# Patient Record
Sex: Male | Born: 1950
Health system: Southern US, Community
[De-identification: ages and names within clinical notes are randomized; demographics above are authoritative.]

## PROBLEM LIST (undated history)

## (undated) DIAGNOSIS — I10 Essential (primary) hypertension: Secondary | ICD-10-CM

## (undated) DIAGNOSIS — M109 Gout, unspecified: Secondary | ICD-10-CM

## (undated) DIAGNOSIS — R7309 Other abnormal glucose: Secondary | ICD-10-CM

## (undated) DIAGNOSIS — R011 Cardiac murmur, unspecified: Secondary | ICD-10-CM

## (undated) DIAGNOSIS — IMO0001 Reserved for inherently not codable concepts without codable children: Secondary | ICD-10-CM

## (undated) DIAGNOSIS — N289 Disorder of kidney and ureter, unspecified: Secondary | ICD-10-CM

## (undated) DIAGNOSIS — E119 Type 2 diabetes mellitus without complications: Secondary | ICD-10-CM

## (undated) DIAGNOSIS — K219 Gastro-esophageal reflux disease without esophagitis: Secondary | ICD-10-CM

## (undated) DIAGNOSIS — E7800 Pure hypercholesterolemia, unspecified: Secondary | ICD-10-CM

## (undated) DIAGNOSIS — K279 Peptic ulcer, site unspecified, unspecified as acute or chronic, without hemorrhage or perforation: Secondary | ICD-10-CM

## (undated) HISTORY — DX: Other abnormal glucose: R73.09

## (undated) HISTORY — DX: Reserved for inherently not codable concepts without codable children: IMO0001

## (undated) HISTORY — DX: Gout, unspecified: M10.9

## (undated) HISTORY — DX: Gastro-esophageal reflux disease without esophagitis: K21.9

## (undated) HISTORY — PX: ABDOMINAL SURGERY: SHX537

## (undated) HISTORY — DX: Peptic ulcer, site unspecified, unspecified as acute or chronic, without hemorrhage or perforation: K27.9

---

## 2011-05-19 ENCOUNTER — Emergency Department (HOSPITAL_COMMUNITY)
Admission: EM | Admit: 2011-05-19 | Discharge: 2011-05-19 | Disposition: A | Discharge status: Home / Self Care | Payer: 59 | Attending: Emergency Medicine | Admitting: Emergency Medicine

## 2011-05-19 ENCOUNTER — Encounter (HOSPITAL_COMMUNITY): Payer: Self-pay | Admitting: *Deleted

## 2011-05-19 ENCOUNTER — Emergency Department (HOSPITAL_COMMUNITY): Payer: 59

## 2011-05-19 DIAGNOSIS — S81809A Unspecified open wound, unspecified lower leg, initial encounter: Secondary | ICD-10-CM | POA: Insufficient documentation

## 2011-05-19 DIAGNOSIS — S81009A Unspecified open wound, unspecified knee, initial encounter: Secondary | ICD-10-CM | POA: Insufficient documentation

## 2011-05-19 DIAGNOSIS — Z23 Encounter for immunization: Secondary | ICD-10-CM | POA: Insufficient documentation

## 2011-05-19 DIAGNOSIS — I10 Essential (primary) hypertension: Secondary | ICD-10-CM | POA: Insufficient documentation

## 2011-05-19 DIAGNOSIS — IMO0002 Reserved for concepts with insufficient information to code with codable children: Secondary | ICD-10-CM

## 2011-05-19 DIAGNOSIS — Z87891 Personal history of nicotine dependence: Secondary | ICD-10-CM | POA: Insufficient documentation

## 2011-05-19 DIAGNOSIS — W298XXA Contact with other powered powered hand tools and household machinery, initial encounter: Secondary | ICD-10-CM | POA: Insufficient documentation

## 2011-05-19 DIAGNOSIS — S91009A Unspecified open wound, unspecified ankle, initial encounter: Secondary | ICD-10-CM | POA: Insufficient documentation

## 2011-05-19 HISTORY — DX: Essential (primary) hypertension: I10

## 2011-05-19 HISTORY — DX: Pure hypercholesterolemia, unspecified: E78.00

## 2011-05-19 MED ORDER — HYDROCODONE-ACETAMINOPHEN 5-325 MG PO TABS: 1.0000 | ORAL_TABLET | ORAL | Status: AC | PRN

## 2011-05-19 MED ORDER — TETANUS-DIPHTH-ACELL PERTUSSIS 5-2.5-18.5 LF-MCG/0.5 IM SUSP: 0.5000 mL | Freq: Once | INTRAMUSCULAR | Status: DC

## 2011-05-19 MED ORDER — CEPHALEXIN 500 MG PO CAPS: 500.0000 mg | ORAL_CAPSULE | Freq: Four times a day (QID) | ORAL | Status: AC

## 2011-05-19 MED ORDER — TETANUS-DIPHTHERIA TOXOIDS TD 5-2 LFU IM INJ
0.5000 mL | INJECTION | Freq: Once | INTRAMUSCULAR | Status: AC
  Administered 2011-05-19: 0.5 mL via INTRAMUSCULAR
  Filled 2011-05-19: qty 0.5

## 2011-05-19 MED ORDER — LIDOCAINE HCL (PF) 1 % IJ SOLN
INTRAMUSCULAR | Status: AC
  Filled 2011-05-19: qty 5

## 2011-05-19 NOTE — ED Notes (Signed)
EDP informed that Tdap unavailable.

## 2011-05-19 NOTE — ED Notes (Signed)
Large lac to right lower leg while using a skill saw. Bleeding controlled. NAD

## 2011-05-19 NOTE — ED Notes (Signed)
Pt had laceration to rt leg has been cleaned and irrigated at this time with saline and betadine also scrubbed with betadine scrub brush. Sheron Robin

## 2011-05-19 NOTE — ED Provider Notes (Addendum)
This chart was scribed for [employer]. Colon Branch, MD by [name]. The patient was seen in room [facility] at 4:23 PM.  CSN: [id number]  Arrival date & time 05/19/11  1618   First MD Initiated Contact with Patient 05/19/11 1621      Chief Complaint  Patient presents with  . Extremity Laceration    (Consider location/radiation/quality/duration/timing/severity/associated sxs/prior treatment) Patient is a 61 y.o. male presenting with skin laceration. The history is provided by the patient.  Laceration  The incident occurred less than 1 hour ago. The laceration is located on the right leg. The laceration is 11-20 cm in size. Injury mechanism: skill saw. The pain is mild. The pain has been constant since onset. He reports no foreign bodies present. His tetanus status is out of date.   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 Large open laceration. Pt is in no apparent distress or pain.   Past Medical History  Diagnosis Date  . Hypertension   . Hypercholesteremia     Past Surgical History  Procedure Date  . Abdominal surgery     No family history on file.  History  Substance Use Topics  . Smoking status: Former Games developer  . Smokeless tobacco: Not on file  . Alcohol Use: No      Review of Systems 10 Systems reviewed and are negative for acute change except as noted in the HPI.  Allergies  Review of patient's allergies indicates no known allergies.  Home Medications  No current outpatient prescriptions on file.  There were no vitals taken for this visit.  Physical Exam  Nursing note and vitals reviewed. Constitutional: He is oriented to person, place, and time. He appears well-developed and well-nourished.  HENT:  Head: Normocephalic and atraumatic.  Eyes: Conjunctivae are normal. Pupils are equal, round, and reactive to light.  Neck: Normal range of motion. Neck supple.  Cardiovascular: Normal rate, regular  rhythm and normal heart sounds.   Pulmonary/Chest: Effort normal and breath sounds normal.  Abdominal: Soft. There is no tenderness.  Neurological: He is alert and oriented to person, place, and time.  Skin: Skin is warm and dry.       12 cm laceration of the lower right leg. Bleeding controlled.  Psychiatric: He has a normal mood and affect. His behavior is normal.    ED Course  LACERATION REPAIR Date/Time: 05/19/2011 5:15 PM Performed by: IDOL, JULIE L Authorized by: Burgess Amor L Consent: Verbal consent obtained. Risks and benefits: risks, benefits and alternatives were discussed Consent given by: patient Patient identity confirmed: verbally with patient Time out: Immediately prior to procedure a "time out" was called to verify the correct patient, procedure, equipment, support staff and site/side marked as required. Body area: lower extremity Location details: right lower leg Laceration length: 12 cm Tendon involvement: none Nerve involvement: none Vascular damage: no Anesthesia: local infiltration Local anesthetic: lidocaine 1% without epinephrine Anesthetic total: 10 ml Preparation: Patient was prepped and draped in the usual sterile fashion. Irrigation solution: saline Amount of cleaning: extensive (betadine scrub) Degree of undermining: none Skin closure: staples Number of sutures: 13 Approximation: close Approximation difficulty: simple Patient tolerance: Patient tolerated the procedure well with no immediate complications.   (including critical care time) DIAGNOSTIC STUDIES: Oxygen Saturation is 97% on room air, normal by my interpretation.    COORDINATION OF CARE: Dg Tibia/fibula Right  05/19/2011  *RADIOLOGY REPORT*  Clinical Data: Deep  laceration to right leg with circular saw.  RIGHT TIBIA AND FIBULA - 2 VIEW  Comparison:  None.  Findings: There is no evidence of fracture or other focal bone lesions. Soft tissue injury is seen overlying the tibia on the AP  projection.  No evidence of radiopaque foreign body.  IMPRESSION:  No evidence of fracture or radiopaque foreign body.  Original Report Authenticated By: Danae Orleans, M.D.       MDM  Patient who sustained a laceration to his leg while cutting lumber. Tdap not available. Given tetanus. Laceration repaired by mied level. Initiated antibiotic treatment. Xray negative for bone injury. I personally performed the services described in this documentation, which was scribed in my presence. The recorded information has been reviewed and considered.  MDM Reviewed: nursing note and vitals Interpretation: x-ray          Candis Musa, PA 05/19/11 1717  Nicoletta Dress. Colon Branch, MD 05/19/11 1721  Nicoletta Dress. Colon Branch, MD 05/19/11 1730

## 2011-11-28 ENCOUNTER — Other Ambulatory Visit: Payer: Self-pay | Admitting: Family Medicine

## 2011-11-28 DIAGNOSIS — N289 Disorder of kidney and ureter, unspecified: Secondary | ICD-10-CM

## 2011-11-30 ENCOUNTER — Ambulatory Visit (HOSPITAL_COMMUNITY)
Admission: RE | Admit: 2011-11-30 | Discharge: 2011-11-30 | Disposition: A | Discharge status: Home / Self Care | Payer: 59 | Source: Ambulatory Visit | Attending: Family Medicine | Admitting: Family Medicine

## 2011-11-30 DIAGNOSIS — N289 Disorder of kidney and ureter, unspecified: Secondary | ICD-10-CM | POA: Insufficient documentation

## 2012-02-21 ENCOUNTER — Ambulatory Visit (HOSPITAL_COMMUNITY)
Admission: RE | Admit: 2012-02-21 | Discharge: 2012-02-21 | Disposition: A | Discharge status: Home / Self Care | Payer: 59 | Source: Ambulatory Visit | Attending: Nephrology | Admitting: Nephrology

## 2012-02-21 DIAGNOSIS — N261 Atrophy of kidney (terminal): Secondary | ICD-10-CM

## 2012-02-21 DIAGNOSIS — I129 Hypertensive chronic kidney disease with stage 1 through stage 4 chronic kidney disease, or unspecified chronic kidney disease: Secondary | ICD-10-CM | POA: Insufficient documentation

## 2012-02-21 DIAGNOSIS — N269 Renal sclerosis, unspecified: Secondary | ICD-10-CM | POA: Insufficient documentation

## 2012-02-21 DIAGNOSIS — N19 Unspecified kidney failure: Secondary | ICD-10-CM

## 2012-02-21 DIAGNOSIS — I10 Essential (primary) hypertension: Secondary | ICD-10-CM

## 2012-02-21 NOTE — Progress Notes (Addendum)
*  PRELIMINARY RESULTS* Vascular Ultrasound Renal Artery Duplex has been completed.   Technically difficult study due to excessive bowel gas. There is no obvious evidence of renal artery stenosis bilaterally. Unable to visualize several intrarenal artery segments as well as renal artery segments bilaterally, therefore unable to exclude stenosis in these segments. The left kidney is atrophic with evidence of cortical thinning. Intrarenal resistive indices are elevated bilaterally. There is evidence of moderate, irregular, heterogenous plaque throughout the aorta. The celiac artery demonstrates elevated velocities, suggestive of a >70% stenosis. Attempted to call Dr.Patel with preliminary results with no answer. Patient sent home and advised that if there are any  further instructions for him he will be contacted.  02/21/2012 11:09 AM Gertie Fey, RDMS, RDCS

## 2012-07-15 ENCOUNTER — Other Ambulatory Visit: Payer: Self-pay | Admitting: Family Medicine

## 2012-07-15 LAB — PSA: PSA: 0.79 ng/mL (ref <=4.00)

## 2012-07-15 LAB — HEPATIC FUNCTION PANEL
ALT: 28 U/L (ref 0–53)
AST: 28 U/L (ref 0–37)
Albumin: 4.8 g/dL (ref 3.5–5.2)
Total Protein: 7.2 g/dL (ref 6.0–8.3)

## 2012-07-15 LAB — BASIC METABOLIC PANEL
Calcium: 9.8 mg/dL (ref 8.4–10.5)
Glucose, Bld: 158 mg/dL — ABNORMAL HIGH (ref 70–99)
Potassium: 4.3 mEq/L (ref 3.5–5.3)
Sodium: 139 mEq/L (ref 135–145)

## 2012-07-15 LAB — LIPID PANEL
Cholesterol: 233 mg/dL — ABNORMAL HIGH (ref 0–200)
HDL: 33 mg/dL — ABNORMAL LOW (ref >39)
Total CHOL/HDL Ratio: 7.1 Ratio
VLDL: 43 mg/dL — ABNORMAL HIGH (ref 0–40)

## 2012-07-30 ENCOUNTER — Encounter: Payer: Self-pay | Admitting: *Deleted

## 2012-08-01 ENCOUNTER — Encounter: Payer: Self-pay | Admitting: Family Medicine

## 2012-08-01 ENCOUNTER — Ambulatory Visit (INDEPENDENT_AMBULATORY_CARE_PROVIDER_SITE_OTHER): Payer: 59 | Admitting: Family Medicine

## 2012-08-01 VITALS — BP 136/84 | HR 80 | Wt 194.0 lb

## 2012-08-01 DIAGNOSIS — R7309 Other abnormal glucose: Secondary | ICD-10-CM

## 2012-08-01 DIAGNOSIS — E119 Type 2 diabetes mellitus without complications: Secondary | ICD-10-CM

## 2012-08-01 DIAGNOSIS — R739 Hyperglycemia, unspecified: Secondary | ICD-10-CM

## 2012-08-01 DIAGNOSIS — I10 Essential (primary) hypertension: Secondary | ICD-10-CM

## 2012-08-01 DIAGNOSIS — E785 Hyperlipidemia, unspecified: Secondary | ICD-10-CM

## 2012-08-01 DIAGNOSIS — Z23 Encounter for immunization: Secondary | ICD-10-CM

## 2012-08-01 DIAGNOSIS — E1122 Type 2 diabetes mellitus with diabetic chronic kidney disease: Secondary | ICD-10-CM | POA: Insufficient documentation

## 2012-08-01 LAB — POCT GLYCOSYLATED HEMOGLOBIN (HGB A1C): Hemoglobin A1C: 6.9

## 2012-08-01 MED ORDER — PNEUMOCOCCAL VAC POLYVALENT 25 MCG/0.5ML IJ INJ: 0.5000 mL | INJECTION | Freq: Once | INTRAMUSCULAR | Status: DC

## 2012-08-01 NOTE — Progress Notes (Signed)
  Subjective:    Patient ID: Hunter Franklin, male    DOB: 1950-09-12, 62 y.o.   MRN: [id number]  Diabetes He presents for his initial diabetic visit. He has type 2 diabetes mellitus. His disease course has been worsening. Associated symptoms include polydipsia. Pertinent negatives for diabetes include no blurred vision, no polyphagia and no polyuria. There are no hypoglycemic complications. Symptoms are stable. Risk factors for coronary artery disease include diabetes mellitus, dyslipidemia and hypertension. Current diabetic treatment includes diet. He is compliant with treatment most of the time.    Hx of elevated sugars. Mo has diabetes. So-so control of sugars. Not a lot of sweets  Review of Systems  Eyes: Negative for blurred vision.  Endocrine: Positive for polydipsia. Negative for polyphagia and polyuria.   Results for orders placed in visit on 07/15/12  HEPATIC FUNCTION PANEL      Result Value Range   Total Bilirubin 0.7  0.3 - 1.2 mg/dL   Bilirubin, Direct 0.1  0.0 - 0.3 mg/dL   Indirect Bilirubin 0.6  0.0 - 0.9 mg/dL   Alkaline Phosphatase 61  39 - 117 U/L   AST 28  0 - 37 U/L   ALT 28  0 - 53 U/L   Total Protein 7.2  6.0 - 8.3 g/dL   Albumin 4.8  3.5 - 5.2 g/dL       Objective:   Physical Exam  Alert. Vitals reviewed. HEENT normal. Lungs clear. Heart regular in rhythm. Feet sensation intact. Pulses good. No significant edema.      Results for orders placed in visit on 08/01/12  POCT GLYCOSYLATED HEMOGLOBIN (HGB A1C)      Result Value Range   Hemoglobin A1C 6.9      Assessment & Plan:  Impression #1 type 2 diabetes new onset. Discussed at great length. #2 hypertension good control. Plan prescribe glucometer. Check to sugars per week. Educational session at a hospital strongly encourage. Yearly eye Dr. visits strongly encourage. No medications at this time rationale discussed. Pneumonia shot. Recheck in several months. Easily 30 minutes spent most in discussion. Aspirin 81  mg daily encouraged to 2 multitude of risk factors. WSL

## 2012-08-01 NOTE — Patient Instructions (Addendum)
Take 81 mg aspirin each day

## 2012-08-03 MED ORDER — ASPIRIN 81 MG PO CHEW: 81.0000 mg | CHEWABLE_TABLET | Freq: Every day | ORAL | Status: AC

## 2012-08-07 ENCOUNTER — Encounter (HOSPITAL_COMMUNITY): Payer: Self-pay | Admitting: Dietician

## 2012-08-07 NOTE — Progress Notes (Signed)
[facility] Diabetes Class Completion  Date:August 07, 2012  Time: 1730  Pt attended [facility]'s Diabetes [facility] Education Class on August 07, 2012.   Patient was educated on the following topics: survival skills (signs and symptoms of hyperglycemia and hypoglycemia, treatment for hypoglycemia, ideal levels for fasting and postprandial blood sugars, goal Hgb A1c level, foot care basics), recommendations for physical activity, carbohydrate metabolism in relation to diabetes, and meal planning (sources of carbohydrate, carbohydrate counting, meal planning strategies, food label reading, and portion control).   Melody Haver, RD, LDN

## 2012-10-07 ENCOUNTER — Other Ambulatory Visit: Payer: Self-pay | Admitting: Family Medicine

## 2012-10-21 ENCOUNTER — Ambulatory Visit (INDEPENDENT_AMBULATORY_CARE_PROVIDER_SITE_OTHER): Payer: 59 | Admitting: Family Medicine

## 2012-10-21 ENCOUNTER — Encounter: Payer: Self-pay | Admitting: Family Medicine

## 2012-10-21 VITALS — BP 128/78 | HR 70 | Ht 67.0 in | Wt 172.5 lb

## 2012-10-21 DIAGNOSIS — E785 Hyperlipidemia, unspecified: Secondary | ICD-10-CM

## 2012-10-21 DIAGNOSIS — I10 Essential (primary) hypertension: Secondary | ICD-10-CM

## 2012-10-21 DIAGNOSIS — E119 Type 2 diabetes mellitus without complications: Secondary | ICD-10-CM

## 2012-10-21 NOTE — Progress Notes (Signed)
  Subjective:    Patient ID: Hunter Franklin, male    DOB: 1951-01-17, 62 y.o.   MRN: [id number]  Diabetes He presents for his follow-up diabetic visit. He has type 2 diabetes mellitus. Pertinent negatives for hypoglycemia include no confusion, dizziness or mood changes. Pertinent negatives for diabetes include no blurred vision, no chest pain and no fatigue. There are no hypoglycemic complications. Symptoms are improving. Risk factors for coronary artery disease include diabetes mellitus. When asked about current treatments, none were reported. He is compliant with treatment all of the time.     Results for orders placed in visit on 10/21/12  POCT GLYCOSYLATED HEMOGLOBIN (HGB A1C)      Result Value Range   Hemoglobin A1C 5.8     BPs generally Review of Systems  Constitutional: Negative for fatigue.  Eyes: Negative for blurred vision.  Cardiovascular: Negative for chest pain.  Neurological: Negative for dizziness.  Psychiatric/Behavioral: Negative for confusion.       Objective:   Physical Exam Alert. HEENT normal. Lungs clear. Heart regular rate and rhythm. Vital stable. Abdomen benign. Extremities without edema. Feet pulses good sensation intact.       Assessment & Plan:  Impression #1 type 2 diabetes clinically improved. #2 hypertension good control. #3 hyperlipidemia. Plan maintain same meds. Diet exercise discussed in encourage check in 6 months. WSL

## 2012-11-10 ENCOUNTER — Other Ambulatory Visit: Payer: Self-pay | Admitting: Family Medicine

## 2013-01-29 ENCOUNTER — Other Ambulatory Visit: Payer: Self-pay | Admitting: Family Medicine

## 2013-02-23 ENCOUNTER — Encounter: Payer: Self-pay | Admitting: Family Medicine

## 2013-02-23 ENCOUNTER — Ambulatory Visit (INDEPENDENT_AMBULATORY_CARE_PROVIDER_SITE_OTHER): Payer: 59 | Admitting: Family Medicine

## 2013-02-23 VITALS — BP 130/88 | Ht 67.0 in | Wt 170.0 lb

## 2013-02-23 DIAGNOSIS — Z79899 Other long term (current) drug therapy: Secondary | ICD-10-CM

## 2013-02-23 DIAGNOSIS — E785 Hyperlipidemia, unspecified: Secondary | ICD-10-CM

## 2013-02-23 DIAGNOSIS — Z23 Encounter for immunization: Secondary | ICD-10-CM

## 2013-02-23 DIAGNOSIS — E119 Type 2 diabetes mellitus without complications: Secondary | ICD-10-CM

## 2013-02-23 NOTE — Progress Notes (Signed)
  Subjective:    Patient ID: Hunter Franklin, male    DOB: [birth year], 62 y.o.   MRN: [id number]  Diabetes He presents for his follow-up diabetic visit. He has type 2 diabetes mellitus. His disease course has been stable. There are no hypoglycemic associated symptoms. There are no diabetic associated symptoms. There are no hypoglycemic complications. Symptoms are stable. There are no diabetic complications. There are no known risk factors for coronary artery disease. When asked about current treatments, none were reported. He is compliant with treatment all of the time.  Patient states he has no concerns at this time.   Results for orders placed in visit on 02/23/13  POCT GLYCOSYLATED HEMOGLOBIN (HGB A1C)      Result Value Range   Hemoglobin A1C 5.5     Staying veryactive, has cut dsown a lot of weight.  Watches salt only soso. Compliant with her blood pressure medication. Checks his blood pressure elsewhere and numbers are generally in good control.  Trying to watch fats in his diet. Compliant with medications. No obvious side effects from it.  Flu vac tod  Feet feel a little numb in the toes . On further history just a great toe. Dorsum bilateral. Worse after wearing heavy boots. No tingling or numbness elsewhere.    Review of Systems No chest pain no headache no back pain some weight loss with his effort no change in bowel habits no abdominal pain no blood in stool ROS otherwise negative    Objective:   Physical Exam  Alert HEENT normal. Lungs clear. Heart rare rate and rhythm. Feet exam see elsewhere      Assessment & Plan:  Impression 1 type 2 diabetes control excellent A1c is in the low fives. Discussed. #2 hyperlipidemia status uncertain discuss. #3 hypertension good control. Numbers in good range. Plan maintain same. Flu shot today. Diet exercise discussed. Appropriate blood work. Further recommendations based results. Check in 6 months. WSL

## 2013-03-09 LAB — BASIC METABOLIC PANEL
BUN: 29 mg/dL — ABNORMAL HIGH (ref 6–23)
Chloride: 108 mEq/L (ref 96–112)
Glucose, Bld: 112 mg/dL — ABNORMAL HIGH (ref 70–99)
Potassium: 4.3 mEq/L (ref 3.5–5.3)
Sodium: 142 mEq/L (ref 135–145)

## 2013-03-09 LAB — LIPID PANEL
Cholesterol: 145 mg/dL (ref 0–200)
LDL Cholesterol: 88 mg/dL (ref 0–99)
Total CHOL/HDL Ratio: 3.3 Ratio
VLDL: 13 mg/dL (ref 0–40)

## 2013-03-09 LAB — HEPATIC FUNCTION PANEL
ALT: 37 U/L (ref 0–53)
Bilirubin, Direct: 0.1 mg/dL (ref 0.0–0.3)
Indirect Bilirubin: 0.3 mg/dL (ref 0.0–0.9)
Total Bilirubin: 0.4 mg/dL (ref 0.3–1.2)

## 2013-03-10 LAB — MICROALBUMIN, URINE: Microalb, Ur: 3.35 mg/dL — ABNORMAL HIGH (ref 0.00–1.89)

## 2013-03-19 ENCOUNTER — Encounter: Payer: Self-pay | Admitting: Family Medicine

## 2013-04-06 ENCOUNTER — Other Ambulatory Visit: Payer: Self-pay | Admitting: Family Medicine

## 2013-04-15 ENCOUNTER — Other Ambulatory Visit: Payer: Self-pay | Admitting: Family Medicine

## 2013-08-20 ENCOUNTER — Ambulatory Visit (INDEPENDENT_AMBULATORY_CARE_PROVIDER_SITE_OTHER): Payer: 59 | Admitting: Family Medicine

## 2013-08-20 ENCOUNTER — Encounter: Payer: Self-pay | Admitting: Family Medicine

## 2013-08-20 VITALS — BP 124/80 | Temp 98.4°F | Ht 66.0 in | Wt 173.0 lb

## 2013-08-20 DIAGNOSIS — B029 Zoster without complications: Secondary | ICD-10-CM

## 2013-08-20 MED ORDER — VALACYCLOVIR HCL 1 G PO TABS: 1000.0000 mg | ORAL_TABLET | Freq: Three times a day (TID) | ORAL | Status: DC

## 2013-08-20 NOTE — Patient Instructions (Signed)
Shingles Shingles (herpes zoster) is an infection that is caused by the same virus that causes chickenpox (varicella). The infection causes a painful skin rash and fluid-filled blisters, which eventually break open, crust over, and heal. It may occur in any area of the body, but it usually affects only one side of the body or face. The pain of shingles usually lasts about 1 month. However, some people with shingles may develop long-term (chronic) pain in the affected area of the body. Shingles often occurs many years after the person had chickenpox. It is more common:  In people older than 50 years.  In people with weakened immune systems, such as those with HIV, AIDS, or cancer.  In people taking medicines that weaken the immune system, such as transplant medicines.  In people under great stress. CAUSES  Shingles is caused by the varicella zoster virus (VZV), which also causes chickenpox. After a person is infected with the virus, it can remain in the person's body for years in an inactive state (dormant). To cause shingles, the virus reactivates and breaks out as an infection in a nerve root. The virus can be spread from person to person (contagious) through contact with open blisters of the shingles rash. It will only spread to people who have not had chickenpox. When these people are exposed to the virus, they may develop chickenpox. They will not develop shingles. Once the blisters scab over, the person is no longer contagious and cannot spread the virus to others. SYMPTOMS  Shingles shows up in stages. The initial symptoms may be pain, itching, and tingling in an area of the skin. This pain is usually described as burning, stabbing, or throbbing.In a few days or weeks, a painful red rash will appear in the area where the pain, itching, and tingling were felt. The rash is usually on one side of the body in a band or belt-like pattern. Then, the rash usually turns into fluid-filled blisters. They  will scab over and dry up in approximately 2 3 weeks. Flu-like symptoms may also occur with the initial symptoms, the rash, or the blisters. These may include:  Fever.  Chills.  Headache.  Upset stomach. DIAGNOSIS  Your caregiver will perform a skin exam to diagnose shingles. Skin scrapings or fluid samples may also be taken from the blisters. This sample will be examined under a microscope or sent to a lab for further testing. TREATMENT  There is no specific cure for shingles. Your caregiver will likely prescribe medicines to help you manage the pain, recover faster, and avoid long-term problems. This may include antiviral drugs, anti-inflammatory drugs, and pain medicines. HOME CARE INSTRUCTIONS   Take a cool bath or apply cool compresses to the area of the rash or blisters as directed. This may help with the pain and itching.   Only take over-the-counter or prescription medicines as directed by your caregiver.   Rest as directed by your caregiver.  Keep your rash and blisters clean with mild soap and cool water or as directed by your caregiver.  Do not pick your blisters or scratch your rash. Apply an anti-itch cream or numbing creams to the affected area as directed by your caregiver.  Keep your shingles rash covered with a loose bandage (dressing).  Avoid skin contact with:  Babies.   Pregnant women.   Children with eczema.   Elderly people with transplants.   People with chronic illnesses, such as leukemia or AIDS.   Wear loose-fitting clothing to help ease   the pain of material rubbing against the rash.  Keep all follow-up appointments with your caregiver.If the area involved is on your face, you may receive a referral for follow-up to a specialist, such as an eye doctor (ophthalmologist) or an ear, nose, and throat (ENT) doctor. Keeping all follow-up appointments will help you avoid eye complications, chronic pain, or disability.  SEEK IMMEDIATE MEDICAL  CARE IF:   You have facial pain, pain around the eye area, or loss of feeling on one side of your face.  You have ear pain or ringing in your ear.  You have loss of taste.  Your pain is not relieved with prescribed medicines.   Your redness or swelling spreads.   You have more pain and swelling.  Your condition is worsening or has changed.   You have a feveror persistent symptoms for more than 2 3 days.  You have a fever and your symptoms suddenly get worse. MAKE SURE YOU:  Understand these instructions.  Will watch your condition.  Will get help right away if you are not doing well or get worse. Document Released: 04/02/2005 Document Revised: 12/26/2011 Document Reviewed: 11/15/2011 [employer] Patient Information 2014 [employer].  

## 2013-08-20 NOTE — Progress Notes (Signed)
   Subjective:    Patient ID: Hunter Franklin, male    DOB: [birth year], 63 y.o.   MRN: [id number]  Rash This is a new problem. The current episode started in the past 7 days. The affected locations include the left arm, left wrist and left hand. The rash is characterized by blistering, burning and pain. Treatments tried: ibuprofen. The treatment provided moderate relief.   Relates moderate pain discomfort denies any weakness   Review of Systems  Skin: Positive for rash.   Denies fever chills    Objective:   Physical Exam  Rest of skin exam is normal but he does have classic shingles from the shoulder area all the way down to the hand blisters on the hands      Assessment & Plan:  Shingles. Valtrex 1 gram TID for 10 days. Warnings discussed, post shingles neuralgia discussed followup if problems

## 2013-09-16 ENCOUNTER — Telehealth: Payer: Self-pay | Admitting: Family Medicine

## 2013-09-16 NOTE — Telephone Encounter (Signed)
Pt was diagnosed with the shingles in the last month Wants to know if he can get a script to go get the shingles  Vaccine at this point?

## 2013-09-16 NOTE — Telephone Encounter (Signed)
Pt's wife notified script ready up front.

## 2013-09-16 NOTE — Telephone Encounter (Signed)
sure

## 2013-10-05 ENCOUNTER — Other Ambulatory Visit: Payer: Self-pay | Admitting: Family Medicine

## 2013-10-19 ENCOUNTER — Other Ambulatory Visit: Payer: Self-pay | Admitting: Family Medicine

## 2014-01-12 LAB — HM DIABETES EYE EXAM

## 2014-01-13 ENCOUNTER — Other Ambulatory Visit: Payer: Self-pay | Admitting: Family Medicine

## 2014-04-05 ENCOUNTER — Other Ambulatory Visit: Payer: Self-pay | Admitting: *Deleted

## 2014-04-05 MED ORDER — PRAVASTATIN SODIUM 20 MG PO TABS: 20.0000 mg | ORAL_TABLET | Freq: Every day | ORAL | Status: DC

## 2014-04-15 ENCOUNTER — Other Ambulatory Visit: Payer: Self-pay | Admitting: Family Medicine

## 2014-04-21 ENCOUNTER — Other Ambulatory Visit: Payer: Self-pay | Admitting: *Deleted

## 2014-04-21 MED ORDER — FENOFIBRATE 160 MG PO TABS: ORAL_TABLET | ORAL | Status: DC

## 2014-05-24 ENCOUNTER — Other Ambulatory Visit: Payer: Self-pay | Admitting: Family Medicine

## 2014-06-14 ENCOUNTER — Encounter: Payer: Self-pay | Admitting: Family Medicine

## 2014-06-14 ENCOUNTER — Ambulatory Visit (INDEPENDENT_AMBULATORY_CARE_PROVIDER_SITE_OTHER): Payer: 59 | Admitting: Family Medicine

## 2014-06-14 VITALS — BP 130/82 | Ht 66.0 in | Wt 184.4 lb

## 2014-06-14 DIAGNOSIS — E785 Hyperlipidemia, unspecified: Secondary | ICD-10-CM | POA: Diagnosis not present

## 2014-06-14 DIAGNOSIS — I10 Essential (primary) hypertension: Secondary | ICD-10-CM | POA: Diagnosis not present

## 2014-06-14 DIAGNOSIS — Z79899 Other long term (current) drug therapy: Secondary | ICD-10-CM

## 2014-06-14 DIAGNOSIS — E119 Type 2 diabetes mellitus without complications: Secondary | ICD-10-CM | POA: Diagnosis not present

## 2014-06-14 DIAGNOSIS — Z125 Encounter for screening for malignant neoplasm of prostate: Secondary | ICD-10-CM

## 2014-06-14 MED ORDER — AMLODIPINE BESYLATE 10 MG PO TABS: 10.0000 mg | ORAL_TABLET | Freq: Every day | ORAL | Status: DC

## 2014-06-14 MED ORDER — AZITHROMYCIN 250 MG PO TABS: ORAL_TABLET | ORAL | Status: DC

## 2014-06-14 MED ORDER — PRAVASTATIN SODIUM 20 MG PO TABS: 20.0000 mg | ORAL_TABLET | Freq: Every day | ORAL | Status: DC

## 2014-06-14 MED ORDER — CARVEDILOL 6.25 MG PO TABS: 6.2500 mg | ORAL_TABLET | Freq: Two times a day (BID) | ORAL | Status: DC

## 2014-06-14 MED ORDER — FENOFIBRATE 160 MG PO TABS: ORAL_TABLET | ORAL | Status: DC

## 2014-06-14 NOTE — Progress Notes (Signed)
   Subjective:    Patient ID: Hunter Franklin, male    DOB: February 12, 1951, 64 y.o.   MRN: [id number]  Hyperlipidemia This is a chronic problem. The current episode started more than 1 year ago. Treatments tried: pravachol, fenofibrate. There are no compliance problems.  Risk factors for coronary artery disease include dyslipidemia and hypertension.   glu mostly 80s and 90. Known history of diabetes. Did not follow-up 6 month checkup is requested. States sugars running good. No medications at this time.  Sticking with meds faithfully   Staying very active . Exercises regularly.  Physically runny nose and cough and congestion   Patient also has a head cold. Several weeks' duration nasal discharge and cough.  Claims compliance with blood pressure medicine. No obvious side effects. Watching salt in diet.  lk  Review of Systems No headache no chest pain no back pain abdominal pain no change in bowel habits no blood in stool    Objective:   Physical Exam Alert blood pressure good on repeat moderate malaise HEENT moderate nasal congestion frontal tenderness pharynx normal lungs clear heart rare rhythm ankles without edema       Assessment & Plan:  Impression 1 hypertension good control #2 hyperlipidemia status uncertain #3 type 2 diabetes status uncertain. #4 rhinosinusitis plan appropriate blood work. Antibiotics prescribed. Diet exercise discussed. Recheck in 6 months. WSL

## 2014-08-07 LAB — HEMOGLOBIN A1C
HEMOGLOBIN A1C: 5.9 % — AB (ref <5.7)
MEAN PLASMA GLUCOSE: 123 mg/dL — AB (ref <117)

## 2014-08-07 LAB — LIPID PANEL
CHOL/HDL RATIO: 5.3 ratio
Cholesterol: 168 mg/dL (ref 0–200)
HDL: 32 mg/dL — ABNORMAL LOW (ref >=40)
LDL CALC: 106 mg/dL — AB (ref 0–99)
Triglycerides: 148 mg/dL (ref <150)
VLDL: 30 mg/dL (ref 0–40)

## 2014-08-07 LAB — HEPATIC FUNCTION PANEL
ALBUMIN: 4.3 g/dL (ref 3.5–5.2)
ALT: 17 U/L (ref 0–53)
AST: 25 U/L (ref 0–37)
Alkaline Phosphatase: 48 U/L (ref 39–117)
BILIRUBIN TOTAL: 0.5 mg/dL (ref 0.2–1.2)
Bilirubin, Direct: 0.1 mg/dL (ref 0.0–0.3)
Indirect Bilirubin: 0.4 mg/dL (ref 0.2–1.2)
Total Protein: 6.7 g/dL (ref 6.0–8.3)

## 2014-08-07 LAB — PSA: PSA: 1.01 ng/mL (ref <=4.00)

## 2014-08-07 LAB — MICROALBUMIN, URINE: Microalb, Ur: 3.1 mg/dL — ABNORMAL HIGH (ref <2.0)

## 2014-08-08 ENCOUNTER — Encounter: Payer: Self-pay | Admitting: Family Medicine

## 2014-09-26 ENCOUNTER — Encounter (HOSPITAL_COMMUNITY): Payer: Self-pay | Admitting: *Deleted

## 2014-09-26 ENCOUNTER — Emergency Department (HOSPITAL_COMMUNITY): Payer: 59

## 2014-09-26 ENCOUNTER — Emergency Department (HOSPITAL_COMMUNITY)
Admission: EM | Admit: 2014-09-26 | Discharge: 2014-09-27 | Disposition: A | Discharge status: Home / Self Care | Payer: 59 | Attending: Emergency Medicine | Admitting: Emergency Medicine

## 2014-09-26 DIAGNOSIS — Z8711 Personal history of peptic ulcer disease: Secondary | ICD-10-CM | POA: Diagnosis not present

## 2014-09-26 DIAGNOSIS — Z7982 Long term (current) use of aspirin: Secondary | ICD-10-CM | POA: Insufficient documentation

## 2014-09-26 DIAGNOSIS — Z79899 Other long term (current) drug therapy: Secondary | ICD-10-CM | POA: Insufficient documentation

## 2014-09-26 DIAGNOSIS — M549 Dorsalgia, unspecified: Secondary | ICD-10-CM | POA: Diagnosis not present

## 2014-09-26 DIAGNOSIS — I10 Essential (primary) hypertension: Secondary | ICD-10-CM | POA: Insufficient documentation

## 2014-09-26 DIAGNOSIS — R509 Fever, unspecified: Secondary | ICD-10-CM | POA: Insufficient documentation

## 2014-09-26 DIAGNOSIS — Z87448 Personal history of other diseases of urinary system: Secondary | ICD-10-CM | POA: Diagnosis not present

## 2014-09-26 DIAGNOSIS — R112 Nausea with vomiting, unspecified: Secondary | ICD-10-CM

## 2014-09-26 DIAGNOSIS — Z8719 Personal history of other diseases of the digestive system: Secondary | ICD-10-CM | POA: Diagnosis not present

## 2014-09-26 DIAGNOSIS — Z792 Long term (current) use of antibiotics: Secondary | ICD-10-CM | POA: Insufficient documentation

## 2014-09-26 DIAGNOSIS — R0981 Nasal congestion: Secondary | ICD-10-CM | POA: Diagnosis not present

## 2014-09-26 DIAGNOSIS — R17 Unspecified jaundice: Secondary | ICD-10-CM | POA: Diagnosis not present

## 2014-09-26 DIAGNOSIS — Z87891 Personal history of nicotine dependence: Secondary | ICD-10-CM | POA: Insufficient documentation

## 2014-09-26 DIAGNOSIS — R7989 Other specified abnormal findings of blood chemistry: Secondary | ICD-10-CM

## 2014-09-26 DIAGNOSIS — E780 Pure hypercholesterolemia: Secondary | ICD-10-CM | POA: Insufficient documentation

## 2014-09-26 DIAGNOSIS — R945 Abnormal results of liver function studies: Secondary | ICD-10-CM

## 2014-09-26 HISTORY — DX: Disorder of kidney and ureter, unspecified: N28.9

## 2014-09-26 LAB — CBC WITH DIFFERENTIAL/PLATELET
BASOS PCT: 0 % (ref 0–1)
Basophils Absolute: 0 10*3/uL (ref 0.0–0.1)
EOS PCT: 2 % (ref 0–5)
Eosinophils Absolute: 0.1 10*3/uL (ref 0.0–0.7)
HCT: 40.6 % (ref 39.0–52.0)
HEMOGLOBIN: 13.8 g/dL (ref 13.0–17.0)
LYMPHS PCT: 10 % — AB (ref 12–46)
Lymphs Abs: 0.6 10*3/uL — ABNORMAL LOW (ref 0.7–4.0)
MCH: 29.2 pg (ref 26.0–34.0)
MCHC: 34 g/dL (ref 30.0–36.0)
MCV: 86 fL (ref 78.0–100.0)
MONOS PCT: 7 % (ref 3–12)
Monocytes Absolute: 0.5 10*3/uL (ref 0.1–1.0)
NEUTROS ABS: 5.1 10*3/uL (ref 1.7–7.7)
Neutrophils Relative %: 81 % — ABNORMAL HIGH (ref 43–77)
PLATELETS: 112 10*3/uL — AB (ref 150–400)
RBC: 4.72 MIL/uL (ref 4.22–5.81)
RDW: 13.2 % (ref 11.5–15.5)
WBC: 6.4 10*3/uL (ref 4.0–10.5)

## 2014-09-26 LAB — COMPREHENSIVE METABOLIC PANEL
ALK PHOS: 66 U/L (ref 38–126)
ALT: 130 U/L — ABNORMAL HIGH (ref 17–63)
ANION GAP: 13 (ref 5–15)
AST: 117 U/L — AB (ref 15–41)
Albumin: 3.8 g/dL (ref 3.5–5.0)
BUN: 25 mg/dL — ABNORMAL HIGH (ref 6–20)
CHLORIDE: 100 mmol/L — AB (ref 101–111)
CO2: 23 mmol/L (ref 22–32)
CREATININE: 1.85 mg/dL — AB (ref 0.61–1.24)
Calcium: 8.5 mg/dL — ABNORMAL LOW (ref 8.9–10.3)
GFR calc Af Amer: 43 mL/min — ABNORMAL LOW (ref >60)
GFR calc non Af Amer: 37 mL/min — ABNORMAL LOW (ref >60)
Glucose, Bld: 118 mg/dL — ABNORMAL HIGH (ref 65–99)
Potassium: 3.8 mmol/L (ref 3.5–5.1)
SODIUM: 136 mmol/L (ref 135–145)
TOTAL PROTEIN: 7.3 g/dL (ref 6.5–8.1)
Total Bilirubin: 6.5 mg/dL — ABNORMAL HIGH (ref 0.3–1.2)

## 2014-09-26 LAB — URINALYSIS, ROUTINE W REFLEX MICROSCOPIC
Glucose, UA: NEGATIVE mg/dL
Ketones, ur: NEGATIVE mg/dL
LEUKOCYTES UA: NEGATIVE
NITRITE: NEGATIVE
PROTEIN: 100 mg/dL — AB
Specific Gravity, Urine: 1.02 (ref 1.005–1.030)
UROBILINOGEN UA: 1 mg/dL (ref 0.0–1.0)
pH: 6 (ref 5.0–8.0)

## 2014-09-26 LAB — URINE MICROSCOPIC-ADD ON

## 2014-09-26 LAB — LIPASE, BLOOD: Lipase: 37 U/L (ref 22–51)

## 2014-09-26 LAB — CBG MONITORING, ED: Glucose-Capillary: 131 mg/dL — ABNORMAL HIGH (ref 65–99)

## 2014-09-26 MED ORDER — ONDANSETRON 4 MG PREPACK (~~LOC~~): 1.0000 | ORAL_TABLET | Freq: Three times a day (TID) | ORAL | Status: DC | PRN

## 2014-09-26 MED ORDER — IOHEXOL 300 MG/ML  SOLN
25.0000 mL | Freq: Once | INTRAMUSCULAR | Status: AC | PRN
  Administered 2014-09-26: 50 mL via ORAL

## 2014-09-26 MED ORDER — SODIUM CHLORIDE 0.9 % IV BOLUS (SEPSIS)
1000.0000 mL | Freq: Once | INTRAVENOUS | Status: AC
  Administered 2014-09-26: 1000 mL via INTRAVENOUS

## 2014-09-26 MED ORDER — SODIUM CHLORIDE 0.9 % IV SOLN
1000.0000 mL | Freq: Once | INTRAVENOUS | Status: AC
  Administered 2014-09-26: 1000 mL via INTRAVENOUS

## 2014-09-26 MED ORDER — ONDANSETRON HCL 4 MG/2ML IJ SOLN
4.0000 mg | Freq: Once | INTRAMUSCULAR | Status: AC
  Administered 2014-09-26: 4 mg via INTRAVENOUS
  Filled 2014-09-26: qty 2

## 2014-09-26 NOTE — Discharge Instructions (Signed)

## 2014-09-26 NOTE — ED Notes (Signed)
Pt c/o sinus drainage, n/v, generalized weakness x 3 days. Wife states pt got bit by a tick 2 weeks ago and was out in the heat since Friday. Wife states pt has been laying around since Friday.

## 2014-09-26 NOTE — ED Notes (Signed)
Patient states nausea and vomiting X2 days with abdominal pain and back pain. Patients skin and eyes appear to be jaundice. Patient states he noticed his eyes were yellow. Patient also states he feels like he need to have a bowel movement but unable to.

## 2014-09-26 NOTE — ED Provider Notes (Signed)
CSN: [id number]     Arrival date & time 09/26/14  1914 History   First MD Initiated Contact with Patient 09/26/14 1934     Chief Complaint  Patient presents with  . Emesis     (Consider location/radiation/quality/duration/timing/severity/associated sxs/prior Treatment) HPI  64 year old male with a history of diabetes presents with multiple episodes of vomiting over the past 48 hours. States 2 nights ago all of a sudden he started feeling bad and had multiple episodes of emesis. The patient had similar vomiting episodes last night and then again tonight. He has been able to keep down fluids and has not been keeping down food. No hematemesis. The patient has not had any diarrhea and had a normal bowel movement yesterday. Has also been having some nasal congestion and sinus drainage as well as diffuse myalgias. Before meals has a mild headache but thinks it started after multiple episodes of emesis. Also having some back pain that he thinks is from the vomiting. Denies any abdominal pain. No urinary symptoms. Wife states he has lost 6 pounds over this last 48 hours.  Past Medical History  Diagnosis Date  . Hypertension   . Hypercholesteremia   . PUD (peptic ulcer disease)   . Gout   . Reflux   . Impaired glucose metabolism   . Renal disorder     1 kidney that is working at 45 %.   Past Surgical History  Procedure Laterality Date  . Abdominal surgery     Family History  Problem Relation Age of Onset  . Diabetes Mother   . Heart attack Mother   . Hypertension Sister    History  Substance Use Topics  . Smoking status: Former Smoker    Quit date: 08/01/1996  . Smokeless tobacco: Not on file  . Alcohol Use: No    Review of Systems  Constitutional: Positive for fever (subjective low grade fever last night).  HENT: Positive for congestion.   Respiratory: Negative for cough and shortness of breath.   Gastrointestinal: Positive for nausea and vomiting. Negative for abdominal pain,  diarrhea and constipation.  Genitourinary: Negative for dysuria.  Musculoskeletal: Positive for back pain.  All other systems reviewed and are negative.     Allergies  Review of patient's allergies indicates no known allergies.  Home Medications   Prior to Admission medications   Medication Sig Start Date End Date Taking? Authorizing Provider  amLODipine (NORVASC) 10 MG tablet Take 1 tablet (10 mg total) by mouth daily. 06/14/14   Mikey Kirschner, MD  aspirin 81 MG tablet Take 81 mg by mouth daily.    Historical Provider, MD  azithromycin (ZITHROMAX Z-PAK) 250 MG tablet Take 2 tablets (500 mg) on  Day 1,  followed by 1 tablet (250 mg) once daily on Days 2 through 5. 06/14/14   Mikey Kirschner, MD  carvedilol (COREG) 6.25 MG tablet Take 1 tablet (6.25 mg total) by mouth 2 (two) times daily with a meal. 06/14/14   Mikey Kirschner, MD  fenofibrate 160 MG tablet TAKE 1 TABLET BY MOUTH ONCE DAILY AT BEDTIME 06/14/14   Mikey Kirschner, MD  pravastatin (PRAVACHOL) 20 MG tablet Take 1 tablet (20 mg total) by mouth at bedtime. 06/14/14   Mikey Kirschner, MD   BP 150/63 mmHg  Pulse 71  Temp(Src) 98.8 F (37.1 C) (Oral)  Resp 20  Ht 5\' 7"  (1.702 m)  Wt 171 lb (77.565 kg)  BMI 26.78 kg/m2  SpO2 97% Physical Exam  Constitutional: He is oriented to person, place, and time. He appears well-developed and well-nourished.  HENT:  Head: Normocephalic and atraumatic.  Right Ear: External ear normal.  Left Ear: External ear normal.  Nose: Nose normal.  Eyes: Right eye exhibits no discharge. Left eye exhibits no discharge.  Neck: Neck supple.  Cardiovascular: Normal rate, regular rhythm, normal heart sounds and intact distal pulses.   Pulmonary/Chest: Effort normal and breath sounds normal.  Abdominal: Soft. He exhibits no distension. There is no tenderness.  Musculoskeletal: He exhibits no edema.  Neurological: He is alert and oriented to person, place, and time.  Skin: Skin is warm and dry.   Mild jaundice to face  Nursing note and vitals reviewed.   ED Course  Procedures (including critical care time) Labs Review Labs Reviewed  CBC WITH DIFFERENTIAL/PLATELET - Abnormal; Notable for the following:    Platelets 112 (*)    Neutrophils Relative % 81 (*)    Lymphocytes Relative 10 (*)    Lymphs Abs 0.6 (*)    All other components within normal limits  COMPREHENSIVE METABOLIC PANEL - Abnormal; Notable for the following:    Chloride 100 (*)    Glucose, Bld 118 (*)    BUN 25 (*)    Creatinine, Ser 1.85 (*)    Calcium 8.5 (*)    AST 117 (*)    ALT 130 (*)    Total Bilirubin 6.5 (*)    GFR calc non Af Amer 37 (*)    GFR calc Af Amer 43 (*)    All other components within normal limits  URINALYSIS, ROUTINE W REFLEX MICROSCOPIC (NOT AT [facility]) - Abnormal; Notable for the following:    Hgb urine dipstick SMALL (*)    Bilirubin Urine LARGE (*)    Protein, ur 100 (*)    All other components within normal limits  CBG MONITORING, ED - Abnormal; Notable for the following:    Glucose-Capillary 131 (*)    All other components within normal limits  LIPASE, BLOOD  URINE MICROSCOPIC-ADD ON    Imaging Review No results found.   EKG Interpretation   Date/Time:  Sunday September 26 2014 20:02:45 EDT Ventricular Rate:  72 PR Interval:  169 QRS Duration: 94 QT Interval:  411 QTC Calculation: 450 R Axis:   -15 Text Interpretation:  Sinus rhythm Borderline left axis deviation  Otherwise normal ECG No old tracing to compare Confirmed by Chia Mowers  MD,  Keeven Matty ([id number]) on 09/26/2014 8:33:48 PM      MDM   Final diagnoses:  Nausea and vomiting in adult    Patient feels much better after IV fluids and Zofran. There has been no vomiting in the ED. He does have mild jaundice to his face and has a new elevated bilirubin as well as moderate ALT and AST elevation. Unclear where this is coming from given that he has no abdominal pain. There is no ultrasound currently available at this  facility at this time, will get a noncontrasted CT scan due to his kidney function to further evaluate for obstruction or bile duct widening. Currently is asymptomatic. Care transferred to Dr. Wilson Singer with CT pending.    Sherwood Gambler, MD 09/26/14 2145

## 2014-09-27 ENCOUNTER — Encounter: Payer: Self-pay | Admitting: Family Medicine

## 2014-09-27 ENCOUNTER — Ambulatory Visit (INDEPENDENT_AMBULATORY_CARE_PROVIDER_SITE_OTHER): Payer: 59 | Admitting: Family Medicine

## 2014-09-27 VITALS — BP 128/74 | Temp 100.7°F | Ht 66.0 in | Wt 177.0 lb

## 2014-09-27 DIAGNOSIS — K810 Acute cholecystitis: Secondary | ICD-10-CM

## 2014-09-27 LAB — BILIRUBIN, DIRECT: Bilirubin, Direct: 4 mg/dL — ABNORMAL HIGH (ref 0.1–0.5)

## 2014-09-27 NOTE — Progress Notes (Signed)
   Subjective:    Patient ID: Hunter Franklin, male    DOB: 1950-09-03, 64 y.o.   MRN: [id number]  Abdominal Pain This is a new problem. Episode onset: 3 days ago. Associated symptoms include vomiting.  taking zofran for nausea and vomiting.  Went to [facility] ED yesterday. They did bloodwork and CT scan.   Overall doing well until several days ago. Feeling somewhat better today. Still having nausea. No active vomiting today.  Has not eaten much food yet.  No headache no chest pain no back pain.   Feeling better today. Feeling weak, no intense pain, vomiting better now that he is taking zofran. Pt not able to sleep.   Review of Systems  Gastrointestinal: Positive for vomiting and abdominal pain.       Objective:   Physical Exam  Vitals stable temp 100.7. Alert slight malaise. No acute distress Excellent bowel sounds. Lungs clear. Heart regular rate and rhythm. Right upper quadrant slightly tender to deep palpation. No CVA tenderness.  Complete hospital chart reviewed in presence of patient     Assessment & Plan:  Impression nausea associated with inflammatory changes gallbladder. White blood count normal. However liver enzymes elevated including bilirubin. Repeat bilirubin improved. Patient states feeling somewhat better today. Would prefer to have his surgery done through [facility]. Dr. Excell Seltzer operated on his wife this past winter. Plan I spoke with surgeon on call. With patient clinically nontoxic and scan revealing gallbladder inflammation. Needs referral soon to surgeon. We will work on this. Addendum patient to be seen on Wednesday. Warning signs discussed. WSL

## 2014-09-27 NOTE — ED Notes (Signed)
Patient and family verbalize understanding of discharge instructions, prescription medications, home care and follow up care. Patient ambulatory out of department at this time with family.

## 2014-09-28 LAB — HEPATITIS PANEL, ACUTE
HCV Ab: 0.2 s/co ratio (ref 0.0–0.9)
Hep A IgM: NEGATIVE
Hep B C IgM: NEGATIVE
Hepatitis B Surface Ag: NEGATIVE

## 2014-09-28 NOTE — Addendum Note (Signed)
Addended by: Carmelina Noun on: 09/28/2014 08:27 AM   Modules accepted: Orders

## 2014-09-29 ENCOUNTER — Inpatient Hospital Stay (HOSPITAL_COMMUNITY)
Admission: AD | Admit: 2014-09-29 | Discharge: 2014-10-02 | DRG: 419 | Disposition: A | Discharge status: Home / Self Care | Payer: 59 | Source: Ambulatory Visit | Attending: General Surgery | Admitting: General Surgery

## 2014-09-29 ENCOUNTER — Other Ambulatory Visit: Payer: Self-pay | Admitting: Surgery

## 2014-09-29 ENCOUNTER — Encounter (HOSPITAL_COMMUNITY): Payer: Self-pay | Admitting: *Deleted

## 2014-09-29 ENCOUNTER — Encounter: Payer: Self-pay | Admitting: General Surgery

## 2014-09-29 ENCOUNTER — Inpatient Hospital Stay (HOSPITAL_COMMUNITY): Payer: 59

## 2014-09-29 DIAGNOSIS — M109 Gout, unspecified: Secondary | ICD-10-CM | POA: Diagnosis present

## 2014-09-29 DIAGNOSIS — Q5310 Unspecified undescended testicle, unilateral: Secondary | ICD-10-CM | POA: Diagnosis not present

## 2014-09-29 DIAGNOSIS — N189 Chronic kidney disease, unspecified: Secondary | ICD-10-CM | POA: Diagnosis present

## 2014-09-29 DIAGNOSIS — Z419 Encounter for procedure for purposes other than remedying health state, unspecified: Secondary | ICD-10-CM

## 2014-09-29 DIAGNOSIS — Z87891 Personal history of nicotine dependence: Secondary | ICD-10-CM | POA: Diagnosis not present

## 2014-09-29 DIAGNOSIS — K829 Disease of gallbladder, unspecified: Secondary | ICD-10-CM | POA: Diagnosis not present

## 2014-09-29 DIAGNOSIS — R17 Unspecified jaundice: Secondary | ICD-10-CM

## 2014-09-29 DIAGNOSIS — I2510 Atherosclerotic heart disease of native coronary artery without angina pectoris: Secondary | ICD-10-CM | POA: Diagnosis present

## 2014-09-29 DIAGNOSIS — K8000 Calculus of gallbladder with acute cholecystitis without obstruction: Secondary | ICD-10-CM | POA: Diagnosis present

## 2014-09-29 DIAGNOSIS — I129 Hypertensive chronic kidney disease with stage 1 through stage 4 chronic kidney disease, or unspecified chronic kidney disease: Secondary | ICD-10-CM | POA: Diagnosis present

## 2014-09-29 DIAGNOSIS — Z7982 Long term (current) use of aspirin: Secondary | ICD-10-CM

## 2014-09-29 DIAGNOSIS — K219 Gastro-esophageal reflux disease without esophagitis: Secondary | ICD-10-CM | POA: Diagnosis present

## 2014-09-29 DIAGNOSIS — E780 Pure hypercholesterolemia: Secondary | ICD-10-CM | POA: Diagnosis present

## 2014-09-29 DIAGNOSIS — Z8249 Family history of ischemic heart disease and other diseases of the circulatory system: Secondary | ICD-10-CM | POA: Diagnosis not present

## 2014-09-29 DIAGNOSIS — R1013 Epigastric pain: Secondary | ICD-10-CM | POA: Diagnosis present

## 2014-09-29 DIAGNOSIS — Z791 Long term (current) use of non-steroidal anti-inflammatories (NSAID): Secondary | ICD-10-CM | POA: Diagnosis not present

## 2014-09-29 DIAGNOSIS — Z833 Family history of diabetes mellitus: Secondary | ICD-10-CM | POA: Diagnosis not present

## 2014-09-29 DIAGNOSIS — K4090 Unilateral inguinal hernia, without obstruction or gangrene, not specified as recurrent: Secondary | ICD-10-CM | POA: Diagnosis present

## 2014-09-29 HISTORY — DX: Disease of gallbladder, unspecified: K82.9

## 2014-09-29 LAB — CBC WITH DIFFERENTIAL/PLATELET
Basophils Absolute: 0 10*3/uL (ref 0.0–0.1)
Basophils Relative: 0 % (ref 0–1)
EOS PCT: 0 % (ref 0–5)
Eosinophils Absolute: 0 10*3/uL (ref 0.0–0.7)
HEMATOCRIT: 39.7 % (ref 39.0–52.0)
HEMOGLOBIN: 13.2 g/dL (ref 13.0–17.0)
LYMPHS PCT: 9 % — AB (ref 12–46)
Lymphs Abs: 1 10*3/uL (ref 0.7–4.0)
MCH: 29.1 pg (ref 26.0–34.0)
MCHC: 33.2 g/dL (ref 30.0–36.0)
MCV: 87.4 fL (ref 78.0–100.0)
MONO ABS: 0.9 10*3/uL (ref 0.1–1.0)
MONOS PCT: 8 % (ref 3–12)
NEUTROS ABS: 9.5 10*3/uL — AB (ref 1.7–7.7)
Neutrophils Relative %: 83 % — ABNORMAL HIGH (ref 43–77)
Platelets: 127 10*3/uL — ABNORMAL LOW (ref 150–400)
RBC: 4.54 MIL/uL (ref 4.22–5.81)
RDW: 13.7 % (ref 11.5–15.5)
WBC: 11.4 10*3/uL — ABNORMAL HIGH (ref 4.0–10.5)

## 2014-09-29 LAB — COMPREHENSIVE METABOLIC PANEL
ALK PHOS: 94 U/L (ref 38–126)
ALT: 50 U/L (ref 17–63)
ANION GAP: 11 (ref 5–15)
AST: 31 U/L (ref 15–41)
Albumin: 3.2 g/dL — ABNORMAL LOW (ref 3.5–5.0)
BILIRUBIN TOTAL: 5.4 mg/dL — AB (ref 0.3–1.2)
BUN: 24 mg/dL — AB (ref 6–20)
CHLORIDE: 96 mmol/L — AB (ref 101–111)
CO2: 26 mmol/L (ref 22–32)
Calcium: 9 mg/dL (ref 8.9–10.3)
Creatinine, Ser: 1.9 mg/dL — ABNORMAL HIGH (ref 0.61–1.24)
GFR calc non Af Amer: 36 mL/min — ABNORMAL LOW (ref >60)
GFR, EST AFRICAN AMERICAN: 42 mL/min — AB (ref >60)
GLUCOSE: 103 mg/dL — AB (ref 65–99)
Potassium: 4.1 mmol/L (ref 3.5–5.1)
Sodium: 133 mmol/L — ABNORMAL LOW (ref 135–145)
TOTAL PROTEIN: 7.1 g/dL (ref 6.5–8.1)

## 2014-09-29 MED ORDER — POTASSIUM CL IN DEXTROSE 5% 20 MEQ/L IV SOLN
20.0000 meq | INTRAVENOUS | Status: DC
  Administered 2014-09-29 – 2014-09-30 (×3): 20 meq via INTRAVENOUS
  Filled 2014-09-29 (×5): qty 1000

## 2014-09-29 MED ORDER — MORPHINE SULFATE 2 MG/ML IJ SOLN
1.0000 mg | INTRAMUSCULAR | Status: DC | PRN
  Administered 2014-09-30: 2 mg via INTRAVENOUS
  Filled 2014-09-29: qty 1

## 2014-09-29 MED ORDER — DIPHENHYDRAMINE HCL 50 MG/ML IJ SOLN
25.0000 mg | Freq: Every evening | INTRAMUSCULAR | Status: DC | PRN
  Administered 2014-10-01: 25 mg via INTRAVENOUS
  Filled 2014-09-29: qty 1

## 2014-09-29 MED ORDER — ACETAMINOPHEN 325 MG PO TABS
650.0000 mg | ORAL_TABLET | Freq: Four times a day (QID) | ORAL | Status: DC | PRN
  Administered 2014-09-29 – 2014-09-30 (×2): 650 mg via ORAL
  Filled 2014-09-29 (×2): qty 2

## 2014-09-29 MED ORDER — HYDROCODONE-ACETAMINOPHEN 5-325 MG PO TABS: 1.0000 | ORAL_TABLET | ORAL | Status: DC | PRN

## 2014-09-29 MED ORDER — ONDANSETRON HCL 4 MG/2ML IJ SOLN: 4.0000 mg | Freq: Four times a day (QID) | INTRAMUSCULAR | Status: DC | PRN

## 2014-09-29 MED ORDER — ACETAMINOPHEN 650 MG RE SUPP: 650.0000 mg | Freq: Four times a day (QID) | RECTAL | Status: DC | PRN

## 2014-09-29 MED ORDER — CEFTRIAXONE SODIUM IN DEXTROSE 40 MG/ML IV SOLN: 2.0000 g | INTRAVENOUS | Status: DC

## 2014-09-29 MED ORDER — CETYLPYRIDINIUM CHLORIDE 0.05 % MT LIQD
7.0000 mL | Freq: Two times a day (BID) | OROMUCOSAL | Status: DC
  Administered 2014-09-29 – 2014-10-01 (×5): 7 mL via OROMUCOSAL

## 2014-09-29 MED ORDER — CEFTRIAXONE SODIUM IN DEXTROSE 20 MG/ML IV SOLN
1.0000 g | INTRAVENOUS | Status: DC
  Administered 2014-09-29: 1 g via INTRAVENOUS
  Filled 2014-09-29 (×2): qty 50

## 2014-09-29 MED ORDER — POTASSIUM CL IN DEXTROSE 5% 20 MEQ/L IV SOLN
20.0000 meq | INTRAVENOUS | Status: DC
  Filled 2014-09-29: qty 1000

## 2014-09-29 NOTE — H&P (Signed)
History of Present Illness Hunter Kitchen T. [name] MD; 09/29/2014 3:31 PM) Patient words: urgent clinic.  The patient is a 64 year old male who presents for evaluation of gall stones. He is referred by Dr. Wolfgang Phoenix for vomiting, elevated LFTs and possible cholecystitis. He was in his usual state of health until 5 days ago when he developed the rapid onset of frequent vomiting. Also had fever at that time. He thought he had the flu and stayed home for a couple of days but the symptoms continued and he presented to [facility] emergency department 3 days ago. At that time lab work was done and he was found to have abnormal LFTs with a bilirubin of 6.5 and transaminases of 117 and 1:30 respectively. Alkaline phosphatase was normal. CBC was unremarkable. CT scan of the abdomen was obtained which showed a somewhat distended gallbladder with mild thickening and a "suggestion" of stones within the gallbladder and normal intrahepatic ducts. He was felt to be okay for discharge home and followed up with Dr. Lurena Joiner in the next day. Repeat bilirubin at that time was 4.0. Apparently hepatitis testing was done per patient it was negative although I do not have these results. At some point during this illness he thinks about 3 or 4 days ago he began to have "soreness" in his right upper quadrant. Denies actual pain but his wife says he is too uncomfortable to sleep. He could not drive to the office today. She feels his skin has been yellow since the onset of illness. No previous history of liver disease or significant abdominal or GI complaints prior to this illness. He has remained nauseated but no further vomiting since this weekend. He's been taking Tylenol regularly and not noted fever. Other Problems Yehuda Mao, RMA; 09/29/2014 2:33 PM) Cholelithiasis Diabetes Mellitus Gastric Ulcer Gastroesophageal Reflux Disease High blood pressure Hypercholesterolemia  Diagnostic Studies History Yehuda Mao,  RMA; 09/29/2014 2:33 PM) Colonoscopy never  Allergies Shirlean Mylar Gwynn, RMA; 09/29/2014 2:34 PM) No Known Drug Allergies 09/29/2014  Medication History Shirlean Mylar Gwynn, RMA; 09/29/2014 2:38 PM) AmLODIPine Besylate (10MG  Tablet, Oral daily) Active. Aspirin Childrens (81MG  Tablet Chewable, Oral daily) Active. Coreg (6.25MG  Tablet, Oral daily) Active. Benadryl Allergy (25MG  Capsule, Oral as needed) Active. Fenofibrate (160MG  Tablet, Oral daily) Active. Tylenol Extra Strength (500MG  Tablet, Oral as needed) Active. Omeprazole (20MG  Capsule DR, Oral as needed) Active. Zofran ODT (4MG  Tablet Disperse, Oral as needed) Active. Pravastatin Sodium (20MG  Tablet, Oral daily) Active. Medications Reconciled  Social History Yehuda Mao, RMA; 09/29/2014 2:33 PM) Alcohol use Remotely quit alcohol use. Caffeine use Coffee. No drug use Tobacco use Former smoker.  Family History Yehuda Mao, RMA; 09/29/2014 2:33 PM) Alcohol Abuse Daughter, Father, Mother, Son. Depression Father, Son. Diabetes Mellitus Mother. Heart Disease Mother. Seizure disorder Son.     Review of Systems ([place] RMA; 09/29/2014 2:33 PM) General Present- Appetite Loss, Fever and Weight Loss. Not Present- Chills, Fatigue, Night Sweats and Weight Gain. Skin Present- Jaundice. Not Present- Change in Wart/Mole, Dryness, Hives, New Lesions, Non-Healing Wounds, Rash and Ulcer. HEENT Present- Hearing Loss. Not Present- Earache, Hoarseness, Nose Bleed, Oral Ulcers, Ringing in the Ears, Seasonal Allergies, Sinus Pain, Sore Throat, Visual Disturbances, Wears glasses/contact lenses and Yellow Eyes. Respiratory Not Present- Bloody sputum, Chronic Cough, Difficulty Breathing, Snoring and Wheezing. Breast Not Present- Breast Mass, Breast Pain, Nipple Discharge and Skin Changes. Cardiovascular Not Present- Chest Pain, Difficulty Breathing Lying Down, Leg Cramps, Palpitations, Rapid Heart Rate, Shortness of Breath and Swelling of  Extremities. Gastrointestinal  Present- Nausea and Vomiting. Not Present- Abdominal Pain, Bloating, Bloody Stool, Change in Bowel Habits, Chronic diarrhea, Constipation, Difficulty Swallowing, Excessive gas, Gets full quickly at meals, Hemorrhoids, Indigestion and Rectal Pain. Male Genitourinary Not Present- Blood in Urine, Change in Urinary Stream, Frequency, Impotence, Nocturia, Painful Urination, Urgency and Urine Leakage. Musculoskeletal Present- Muscle Weakness. Not Present- Back Pain, Joint Pain, Joint Stiffness, Muscle Pain and Swelling of Extremities. Neurological Not Present- Decreased Memory, Fainting, Headaches, Numbness, Seizures, Tingling, Tremor, Trouble walking and Weakness. Psychiatric Not Present- Anxiety, Bipolar, Change in Sleep Pattern, Depression, Fearful and Frequent crying. Endocrine Not Present- Cold Intolerance, Excessive Hunger, Hair Changes, Heat Intolerance, Hot flashes and New Diabetes. Hematology Not Present- Easy Bruising, Excessive bleeding, Gland problems, HIV and Persistent Infections.  Vitals (Robin Gwynn RMA; 09/29/2014 2:39 PM) 09/29/2014 2:38 PM Weight: 172.4 lb Height: 67in Body Surface Area: 1.92 m Body Mass Index: 27 kg/m Temp.: 98.47F  Pulse: 80 (Regular)  BP: 108/72 (Sitting, Left Arm, Standard)     Physical Exam Hunter Kitchen T. [name] MD; 09/29/2014 3:26 PM)  The physical exam findings are as follows: Note:General: Mildly ill-appearing Caucasian male, in no acute distress Skin: Warm and dry without rash or infection. Appears mildly jaundiced Lymph nodes: No cervical, supraclavicular, or inguinal nodes palpable. Lungs: Breath sounds clear and equal. No wheezing or increased work of breathing. Cardiovascular: Regular rate and rhythm without murmer. No JVD or edema. Peripheral pulses intact. No carotid bruits. Abdomen: Nondistended. Localized right upper quadrant tenderness with guarding. No masses palpable. No organomegaly. No palpable  hernias. Extremities: No edema or joint swelling or deformity. No chronic venous stasis changes. Neurologic: Alert and fully oriented. Gait normal. No focal weakness. Psychiatric: Normal mood and affect. Thought content appropriate with normal judgement and insight    Assessment & Plan Hunter Kitchen T. Liyah Higham MD; 09/29/2014 3:36 PM)  LFTS ABNORMAL (790.6  R79.89) Impression: Acute illness with elevated LFTs and vomiting and significant right upper quadrant tenderness. CT scan suggests possible cholecystitis and stones but could not be confirmed. Possible common bile duct stone. I think he needs further workup with ultrasound and possible ERCP to be followed by possible cholecystectomy. The patient is ill-appearing in the office today and very tender and I think this workup needs to be expedited in the hospital and likely be on IV antibiotics in the interim.

## 2014-09-30 ENCOUNTER — Inpatient Hospital Stay (HOSPITAL_COMMUNITY): Payer: 59 | Admitting: Registered Nurse

## 2014-09-30 ENCOUNTER — Encounter (HOSPITAL_COMMUNITY): Payer: Self-pay | Admitting: Registered Nurse

## 2014-09-30 ENCOUNTER — Other Ambulatory Visit: Payer: Self-pay

## 2014-09-30 ENCOUNTER — Inpatient Hospital Stay (HOSPITAL_COMMUNITY): Payer: 59

## 2014-09-30 ENCOUNTER — Encounter (HOSPITAL_COMMUNITY): Admission: AD | Disposition: A | Discharge status: Home / Self Care | Payer: Self-pay | Source: Ambulatory Visit

## 2014-09-30 DIAGNOSIS — K829 Disease of gallbladder, unspecified: Secondary | ICD-10-CM

## 2014-09-30 HISTORY — PX: CHOLECYSTECTOMY: SHX55

## 2014-09-30 LAB — CBC WITH DIFFERENTIAL/PLATELET
Basophils Absolute: 0 10*3/uL (ref 0.0–0.1)
Basophils Relative: 0 % (ref 0–1)
Eosinophils Absolute: 0 10*3/uL (ref 0.0–0.7)
Eosinophils Relative: 0 % (ref 0–5)
HCT: 40.1 % (ref 39.0–52.0)
HEMOGLOBIN: 13.1 g/dL (ref 13.0–17.0)
LYMPHS ABS: 1 10*3/uL (ref 0.7–4.0)
LYMPHS PCT: 9 % — AB (ref 12–46)
MCH: 28.2 pg (ref 26.0–34.0)
MCHC: 32.7 g/dL (ref 30.0–36.0)
MCV: 86.4 fL (ref 78.0–100.0)
MONOS PCT: 8 % (ref 3–12)
Monocytes Absolute: 0.9 10*3/uL (ref 0.1–1.0)
NEUTROS ABS: 8.9 10*3/uL — AB (ref 1.7–7.7)
NEUTROS PCT: 83 % — AB (ref 43–77)
PLATELETS: 124 10*3/uL — AB (ref 150–400)
RBC: 4.64 MIL/uL (ref 4.22–5.81)
RDW: 13.9 % (ref 11.5–15.5)
WBC: 10.8 10*3/uL — AB (ref 4.0–10.5)

## 2014-09-30 LAB — COMPREHENSIVE METABOLIC PANEL
ALBUMIN: 3.1 g/dL — AB (ref 3.5–5.0)
ALT: 45 U/L (ref 17–63)
AST: 32 U/L (ref 15–41)
Alkaline Phosphatase: 105 U/L (ref 38–126)
Anion gap: 13 (ref 5–15)
BUN: 22 mg/dL — ABNORMAL HIGH (ref 6–20)
CO2: 28 mmol/L (ref 22–32)
Calcium: 9.2 mg/dL (ref 8.9–10.3)
Chloride: 95 mmol/L — ABNORMAL LOW (ref 101–111)
Creatinine, Ser: 1.75 mg/dL — ABNORMAL HIGH (ref 0.61–1.24)
GFR calc Af Amer: 46 mL/min — ABNORMAL LOW (ref >60)
GFR calc non Af Amer: 40 mL/min — ABNORMAL LOW (ref >60)
Glucose, Bld: 137 mg/dL — ABNORMAL HIGH (ref 65–99)
Potassium: 4.4 mmol/L (ref 3.5–5.1)
SODIUM: 136 mmol/L (ref 135–145)
Total Bilirubin: 4.6 mg/dL — ABNORMAL HIGH (ref 0.3–1.2)
Total Protein: 7.3 g/dL (ref 6.5–8.1)

## 2014-09-30 LAB — SURGICAL PCR SCREEN
MRSA, PCR: NEGATIVE
STAPHYLOCOCCUS AUREUS: NEGATIVE

## 2014-09-30 LAB — GLUCOSE, CAPILLARY: Glucose-Capillary: 128 mg/dL — ABNORMAL HIGH (ref 65–99)

## 2014-09-30 SURGERY — LAPAROSCOPIC CHOLECYSTECTOMY WITH INTRAOPERATIVE CHOLANGIOGRAM
Anesthesia: General

## 2014-09-30 MED ORDER — LIDOCAINE HCL (CARDIAC) 20 MG/ML IV SOLN
INTRAVENOUS | Status: DC | PRN
  Administered 2014-09-30: 100 mg via INTRAVENOUS

## 2014-09-30 MED ORDER — CISATRACURIUM BESYLATE 20 MG/10ML IV SOLN
INTRAVENOUS | Status: AC
  Filled 2014-09-30: qty 10

## 2014-09-30 MED ORDER — GLYCOPYRROLATE 0.2 MG/ML IJ SOLN
INTRAMUSCULAR | Status: AC
  Filled 2014-09-30: qty 3

## 2014-09-30 MED ORDER — IOHEXOL 300 MG/ML  SOLN
INTRAMUSCULAR | Status: DC | PRN
  Administered 2014-09-30: 7 mL

## 2014-09-30 MED ORDER — CISATRACURIUM BESYLATE (PF) 10 MG/5ML IV SOLN
INTRAVENOUS | Status: DC | PRN
  Administered 2014-09-30: 6 mg via INTRAVENOUS
  Administered 2014-09-30: 2 mg via INTRAVENOUS
  Administered 2014-09-30: 4 mg via INTRAVENOUS

## 2014-09-30 MED ORDER — GLYCOPYRROLATE 0.2 MG/ML IJ SOLN
INTRAMUSCULAR | Status: DC | PRN
  Administered 2014-09-30: .6 mg via INTRAVENOUS

## 2014-09-30 MED ORDER — POTASSIUM CL IN DEXTROSE 5% 20 MEQ/L IV SOLN
20.0000 meq | INTRAVENOUS | Status: DC
  Administered 2014-09-30 – 2014-10-02 (×3): 20 meq via INTRAVENOUS
  Filled 2014-09-30 (×7): qty 1000

## 2014-09-30 MED ORDER — AMLODIPINE BESYLATE 10 MG PO TABS
10.0000 mg | ORAL_TABLET | Freq: Every day | ORAL | Status: DC
  Administered 2014-10-01 – 2014-10-02 (×2): 10 mg via ORAL
  Filled 2014-09-30 (×3): qty 1

## 2014-09-30 MED ORDER — PROPOFOL 10 MG/ML IV BOLUS
INTRAVENOUS | Status: AC
  Filled 2014-09-30: qty 20

## 2014-09-30 MED ORDER — HYDROMORPHONE HCL 1 MG/ML IJ SOLN
INTRAMUSCULAR | Status: AC
  Filled 2014-09-30: qty 1

## 2014-09-30 MED ORDER — ONDANSETRON HCL 4 MG/2ML IJ SOLN
INTRAMUSCULAR | Status: AC
  Filled 2014-09-30: qty 2

## 2014-09-30 MED ORDER — BUPIVACAINE HCL (PF) 0.25 % IJ SOLN
INTRAMUSCULAR | Status: AC
  Filled 2014-09-30: qty 30

## 2014-09-30 MED ORDER — FENTANYL CITRATE (PF) 100 MCG/2ML IJ SOLN
INTRAMUSCULAR | Status: DC | PRN
  Administered 2014-09-30: 100 ug via INTRAVENOUS
  Administered 2014-09-30 (×4): 50 ug via INTRAVENOUS

## 2014-09-30 MED ORDER — LIDOCAINE HCL (CARDIAC) 20 MG/ML IV SOLN
INTRAVENOUS | Status: AC
Start: 2014-09-30 — End: 2014-09-30
  Filled 2014-09-30: qty 5

## 2014-09-30 MED ORDER — EPHEDRINE SULFATE 50 MG/ML IJ SOLN
INTRAMUSCULAR | Status: AC
  Filled 2014-09-30: qty 1

## 2014-09-30 MED ORDER — PANTOPRAZOLE SODIUM 40 MG PO TBEC
40.0000 mg | DELAYED_RELEASE_TABLET | Freq: Every day | ORAL | Status: DC
  Administered 2014-10-01 – 2014-10-02 (×2): 40 mg via ORAL
  Filled 2014-09-30 (×2): qty 1

## 2014-09-30 MED ORDER — FENTANYL CITRATE (PF) 100 MCG/2ML IJ SOLN
INTRAMUSCULAR | Status: AC
  Filled 2014-09-30: qty 2

## 2014-09-30 MED ORDER — PROMETHAZINE HCL 25 MG/ML IJ SOLN
6.2500 mg | INTRAMUSCULAR | Status: DC | PRN
  Administered 2014-09-30: 12.5 mg via INTRAVENOUS

## 2014-09-30 MED ORDER — LACTATED RINGERS IR SOLN
Status: DC | PRN
  Administered 2014-09-30: 3000 mL

## 2014-09-30 MED ORDER — PROPOFOL 10 MG/ML IV BOLUS
INTRAVENOUS | Status: DC | PRN
  Administered 2014-09-30: 170 mg via INTRAVENOUS

## 2014-09-30 MED ORDER — LACTATED RINGERS IV SOLN
INTRAVENOUS | Status: DC | PRN
  Administered 2014-09-30 (×2): via INTRAVENOUS

## 2014-09-30 MED ORDER — EPHEDRINE SULFATE 50 MG/ML IJ SOLN
INTRAMUSCULAR | Status: DC | PRN
  Administered 2014-09-30: 10 mg via INTRAVENOUS

## 2014-09-30 MED ORDER — BUPIVACAINE HCL (PF) 0.25 % IJ SOLN
INTRAMUSCULAR | Status: DC | PRN
  Administered 2014-09-30: 30 mL

## 2014-09-30 MED ORDER — SODIUM CHLORIDE 0.9 % IJ SOLN
INTRAMUSCULAR | Status: AC
  Filled 2014-09-30: qty 10

## 2014-09-30 MED ORDER — MIDAZOLAM HCL 5 MG/5ML IJ SOLN
INTRAMUSCULAR | Status: DC | PRN
  Administered 2014-09-30: 1 mg via INTRAVENOUS

## 2014-09-30 MED ORDER — FENTANYL CITRATE (PF) 250 MCG/5ML IJ SOLN
INTRAMUSCULAR | Status: AC
  Filled 2014-09-30: qty 5

## 2014-09-30 MED ORDER — HYDROMORPHONE HCL 1 MG/ML IJ SOLN
0.2500 mg | INTRAMUSCULAR | Status: DC | PRN
  Administered 2014-09-30 (×3): 0.5 mg via INTRAVENOUS

## 2014-09-30 MED ORDER — SUCCINYLCHOLINE CHLORIDE 20 MG/ML IJ SOLN
INTRAMUSCULAR | Status: DC | PRN
  Administered 2014-09-30: 100 mg via INTRAVENOUS

## 2014-09-30 MED ORDER — HYDROMORPHONE HCL 1 MG/ML IJ SOLN
INTRAMUSCULAR | Status: AC
Start: 2014-09-30 — End: 2014-10-01
  Filled 2014-09-30: qty 1

## 2014-09-30 MED ORDER — MEPERIDINE HCL 50 MG/ML IJ SOLN: 6.2500 mg | INTRAMUSCULAR | Status: DC | PRN

## 2014-09-30 MED ORDER — NEOSTIGMINE METHYLSULFATE 10 MG/10ML IV SOLN
INTRAVENOUS | Status: DC | PRN
  Administered 2014-09-30: 4 mg via INTRAVENOUS

## 2014-09-30 MED ORDER — ONDANSETRON HCL 4 MG/2ML IJ SOLN
INTRAMUSCULAR | Status: DC | PRN
  Administered 2014-09-30: 4 mg via INTRAVENOUS

## 2014-09-30 MED ORDER — CARVEDILOL 6.25 MG PO TABS
6.2500 mg | ORAL_TABLET | Freq: Two times a day (BID) | ORAL | Status: DC
  Administered 2014-09-30 – 2014-10-02 (×4): 6.25 mg via ORAL
  Filled 2014-09-30 (×6): qty 1

## 2014-09-30 MED ORDER — PIPERACILLIN-TAZOBACTAM 3.375 G IVPB
3.3750 g | Freq: Three times a day (TID) | INTRAVENOUS | Status: DC
  Administered 2014-09-30 – 2014-10-02 (×6): 3.375 g via INTRAVENOUS
  Filled 2014-09-30 (×8): qty 50

## 2014-09-30 MED ORDER — PROMETHAZINE HCL 25 MG/ML IJ SOLN
INTRAMUSCULAR | Status: AC
  Filled 2014-09-30: qty 1

## 2014-09-30 MED ORDER — MIDAZOLAM HCL 2 MG/2ML IJ SOLN: 0.5000 mg | Freq: Once | INTRAMUSCULAR | Status: DC | PRN

## 2014-09-30 MED ORDER — MIDAZOLAM HCL 2 MG/2ML IJ SOLN
INTRAMUSCULAR | Status: AC
  Filled 2014-09-30: qty 2

## 2014-09-30 SURGICAL SUPPLY — 37 items
APPLIER CLIP ROT 10 11.4 M/L (STAPLE) ×3
BENZOIN TINCTURE PRP APPL 2/3 (GAUZE/BANDAGES/DRESSINGS) ×3 IMPLANT
CHLORAPREP W/TINT 26ML (MISCELLANEOUS) ×3 IMPLANT
CHOLANGIOGRAM CATH TAUT (CATHETERS) ×3 IMPLANT
CLIP APPLIE ROT 10 11.4 M/L (STAPLE) ×1 IMPLANT
CLOSURE WOUND 1/4X4 (GAUZE/BANDAGES/DRESSINGS) ×1
COVER MAYO STAND STRL (DRAPES) ×3 IMPLANT
DECANTER SPIKE VIAL GLASS SM (MISCELLANEOUS) ×3 IMPLANT
DRAIN CHANNEL 19F RND (DRAIN) ×3 IMPLANT
DRAPE C-ARM 42X120 X-RAY (DRAPES) ×3 IMPLANT
DRAPE LAPAROSCOPIC ABDOMINAL (DRAPES) ×3 IMPLANT
ELECT REM PT RETURN 9FT ADLT (ELECTROSURGICAL) ×3
ELECTRODE REM PT RTRN 9FT ADLT (ELECTROSURGICAL) ×1 IMPLANT
ENDOLOOP SUT PDS II  0 18 (SUTURE) ×6
ENDOLOOP SUT PDS II 0 18 (SUTURE) ×3 IMPLANT
EVACUATOR SILICONE 100CC (DRAIN) ×3 IMPLANT
GLOVE SURG SIGNA 7.5 PF LTX (GLOVE) ×21 IMPLANT
GOWN STRL REUS W/TWL XL LVL3 (GOWN DISPOSABLE) ×15 IMPLANT
HEMOSTAT SURGICEL 4X8 (HEMOSTASIS) ×3 IMPLANT
IV CATH 14GX2 1/4 (CATHETERS) ×3 IMPLANT
IV SET EXTENSION CATH 6 NF (IV SETS) ×3 IMPLANT
KIT BASIN OR (CUSTOM PROCEDURE TRAY) ×3 IMPLANT
LIQUID BAND (GAUZE/BANDAGES/DRESSINGS) ×3 IMPLANT
POUCH RETRIEVAL ECOSAC 10 (ENDOMECHANICALS) ×1 IMPLANT
POUCH RETRIEVAL ECOSAC 10MM (ENDOMECHANICALS) ×2
POUCH SPECIMEN RETRIEVAL 10MM (ENDOMECHANICALS) ×3 IMPLANT
SET IRRIG TUBING LAPAROSCOPIC (IRRIGATION / IRRIGATOR) ×3 IMPLANT
SLEEVE XCEL OPT CAN 5 100 (ENDOMECHANICALS) ×6 IMPLANT
STOPCOCK 4 WAY LG BORE MALE ST (IV SETS) ×3 IMPLANT
STRIP CLOSURE SKIN 1/4X4 (GAUZE/BANDAGES/DRESSINGS) ×2 IMPLANT
SUT ETHILON 2 0 PS N (SUTURE) ×3 IMPLANT
SUT VIC AB 5-0 PS2 18 (SUTURE) ×3 IMPLANT
TOWEL OR 17X26 10 PK STRL BLUE (TOWEL DISPOSABLE) ×3 IMPLANT
TRAY LAPAROSCOPIC (CUSTOM PROCEDURE TRAY) ×3 IMPLANT
TROCAR BLADELESS OPT 5 100 (ENDOMECHANICALS) ×3 IMPLANT
TROCAR XCEL BLUNT TIP 100MML (ENDOMECHANICALS) ×3 IMPLANT
TROCAR XCEL NON-BLD 11X100MML (ENDOMECHANICALS) ×3 IMPLANT

## 2014-09-30 NOTE — Progress Notes (Signed)
ANTIBIOTIC CONSULT NOTE - INITIAL  Pharmacy Consult for Zosyn Indication: cholecystitis  No Known Allergies  Patient Measurements: Height: 5\' 6"  (167.6 cm) Weight: 177 lb 1.9 oz (80.341 kg) IBW/kg (Calculated) : 63.8  Vital Signs: Temp: 100.7 F (38.2 C) (06/16 0600) Temp Source: Oral (06/16 0600) BP: 140/85 mmHg (06/16 0600) Pulse Rate: 79 (06/16 0600) Intake/Output from previous day: 06/15 0701 - 06/16 0700 In: 1750 [I.V.:1700; IV Piggyback:50] Out: 950 [Urine:950] Intake/Output from this shift: Total I/O In: -  Out: 200 [Urine:200]  Labs:  Recent Labs  09/29/14 1722 09/30/14 0425  WBC 11.4* 10.8*  HGB 13.2 13.1  PLT 127* 124*  CREATININE 1.90* 1.75*   Estimated Creatinine Clearance: 43 mL/min (by C-G formula based on Cr of 1.75). No results for input(s): VANCOTROUGH, VANCOPEAK, VANCORANDOM, GENTTROUGH, GENTPEAK, GENTRANDOM, TOBRATROUGH, TOBRAPEAK, TOBRARND, AMIKACINPEAK, AMIKACINTROU, AMIKACIN in the last 72 hours.   Microbiology: No results found for this or any previous visit (from the past 720 hour(s)).  Medical History: Past Medical History  Diagnosis Date  . Hypertension   . Hypercholesteremia   . PUD (peptic ulcer disease)   . Gout   . Reflux   . Impaired glucose metabolism   . Renal disorder     1 kidney that is working at 45 %.   Medications:  Scheduled:  . amLODipine  10 mg Oral Daily  . antiseptic oral rinse  7 mL Mouth Rinse BID  . carvedilol  6.25 mg Oral BID WC  . pantoprazole  40 mg Oral Daily  . piperacillin-tazobactam (ZOSYN)  IV  3.375 g Intravenous Q8H   Anti-infectives    Start     Dose/Rate Route Frequency Ordered Stop   09/30/14 1000  piperacillin-tazobactam (ZOSYN) IVPB 3.375 g     3.375 g 12.5 mL/hr over 240 Minutes Intravenous Every 8 hours 09/30/14 0903     09/29/14 1830  cefTRIAXone (ROCEPHIN) 1 g in dextrose 5 % 50 mL IVPB - Premix  Status:  Discontinued     1 g 100 mL/hr over 30 Minutes Intravenous Every 24 hours  09/29/14 1807 09/30/14 0856   09/29/14 1815  cefTRIAXone (ROCEPHIN) 2 g in dextrose 5 % 50 mL IVPB - Premix  Status:  Discontinued     2 g 100 mL/hr over 30 Minutes Intravenous Every 24 hours 09/29/14 1800 09/29/14 1807     Assessment: 34 yoM with cholecystitis, remove gallbladder today, intra-op cholangiogram, if positive plan ERCP Friday. Received Rocephin x1 dose 6/15, begin Zosyn today, pharmacy dosing assistance.  Goal of Therapy:  Antibiotic appropriate for treatment of infection, dose/schedule appropriate for renal fx  Plan:   Zosyn 3.375gm q8h - 4 hr infusion  Minda Ditto PharmD Pager [phone] 09/30/2014, 9:38 AM

## 2014-09-30 NOTE — Anesthesia Preprocedure Evaluation (Addendum)
Anesthesia Evaluation   Patient awake    Reviewed: Allergy & Precautions, NPO status , Patient's Chart, lab work & pertinent test results  History of Anesthesia Complications Negative for: history of anesthetic complications  Airway Mallampati: II  TM Distance: >3 FB Neck ROM: Full    Dental  (+) Edentulous Upper, Edentulous Lower   Pulmonary COPDformer smoker,  breath sounds clear to auscultation        Cardiovascular hypertension, Pt. on medications and Pt. on home beta blockers - anginaRhythm:Regular Rate:Normal     Neuro/Psych negative neurological ROS     GI/Hepatic Neg liver ROS, GERD-  Medicated and Controlled,N/v with acute chole   Endo/Other  diabetes (glu 137, diet controlled)  Renal/GU Renal InsufficiencyRenal disease (creat 1.75)     Musculoskeletal   Abdominal   Peds  Hematology negative hematology ROS (+)   Anesthesia Other Findings   Reproductive/Obstetrics                           Anesthesia Physical Anesthesia Plan  ASA: III  Anesthesia Plan: General   Post-op Pain Management:    Induction: Intravenous and Rapid sequence  Airway Management Planned: Oral ETT  Additional Equipment:   Intra-op Plan:   Post-operative Plan: Extubation in OR  Informed Consent: I have reviewed the patients History and Physical, chart, labs and discussed the procedure including the risks, benefits and alternatives for the proposed anesthesia with the patient or authorized representative who has indicated his/her understanding and acceptance.     Plan Discussed with: CRNA and Surgeon  Anesthesia Plan Comments: (Plan routine monitors, GETA)        Anesthesia Quick Evaluation

## 2014-09-30 NOTE — Op Note (Signed)
09/29/2014 - 09/30/2014  1:53 PM  PATIENT:  Tretha Sciara, 64 y.o., male, MRN: [id number]  PREOP DIAGNOSIS:  Cholecystitis, cholelithiasis  POSTOP DIAGNOSIS:   Empyema of the gall bladder, cholelithiasis, undescended left testicle, left inguinal hernia (photos in chart)  PROCEDURE:   Procedure(s): LAPAROSCOPIC CHOLECYSTECTOMY WITH INTRAOPERATIVE CHOLANGIOGRAM (5 trocars)  SURGEON:   Alphonsa Overall, M.D.  ASSISTANTMarland Kitchen   Wilmer Floor, PA  ANESTHESIA:   general  Anesthesiologist: Annye Asa, MD CRNA: Victoriano Lain, CRNA; Lollie Sails, CRNA  General  ASA: 3  EBL:  100  ml  BLOOD ADMINISTERED: none  DRAINS: 19 F Blake drain  LOCAL MEDICATIONS USED:   30 cc 1/4% marcaine  SPECIMEN:   Gall bladder  COUNTS CORRECT:  YES  INDICATIONS FOR PROCEDURE:  OLUWASEUN CREMER is a 64 y.o. (DOB: June 04, 1950) white  male whose primary care physician is Mickie Hillier, MD and comes for cholecystectomy.   The indications and risks of the gall bladder surgery were explained to the patient.  The risks include, but are not limited to, infection, bleeding, common bile duct injury and open surgery.  SURGERY:  The patient was taken to room #6 at [facility].  The abdomen was prepped with chloroprep.  The patient was on Zosyn prior to the operation.   A time out was held and the surgical checklist run.   An infraumbilical incision was made into the abdominal cavity.  A 12 mm Hasson trocar was inserted into the abdominal cavity through the infraumbilical incision and secured with a 0 Vicryl suture.  Four additional trocars were inserted: a 10 mm trocar in the sub-xiphoid location, a 5 mm trocar in the right mid subcostal area, a 5 mm trocar in the right lateral subcostal area, and a 5 mm trocar mid way between the xiphoid and umbilicus..   The abdomen was explored and the liver, stomach, and bowel that could be seen were unremarkable.  The patient had evidence of a left testicle that was intra-abdominal.   Adjacent to this was a small to medium left inguinal hernia.  Dr. Jeffie Pollock was available and looked in the operating room and thought that nothing more should be done now.   The gall bladder was acutely inflamed and caked with omentum.  I decompressed the gall bladder and got purulent fluid.  This is consistent with an empyema of the gall bladder.  I grasped the gall bladder and rotated it cephalad.  Disssection was carried down to the gall bladder/cystic duct junction and the cystic duct isolated.   There was a lot of chronic and acute inflammation.   I identified the cystic duct.   An intra-operative cholangiogram was shot.   The intra-operative cholangiogram was shot using a cut off Taut catheter placed through a 14 gauge angiocath in the RUQ.  The Taut catheter was inserted in the cut cystic duct and secured with an endoclip.  A cholangiogram was shot with 10 cc of 1/2 strength Omnipaque.  Using fluoroscopy, the cholangiogram showed the flow of contrast into the common bile duct, up the hepatic radicals, and into the duodenum.  There was no mass or obstruction.  This was a normal intra-operative cholangiogram.   The Taut catheter was removed.  The cystic duct was doubly endolooped with PDS suture and the cystic artery was identified and clipped.  The gall bladder was bluntly and sharpley dissected from the gall bladder bed.   After the gall bladder was removed from the liver, the  gall bladder bed and Triangle of [place] were inspected.  There was no bleeding or bile leak.  The gall bladder was placed in a endocatch bag and delivered through the umbilicus.  The abdomen was irrigated with 3,000 cc saline.  The gall bladder bed was raw.  I placed surgicel in it.  I placed a 78 F drain because of the amount of inflammation, the empyema of the gall bladder, and because the endoloops were on a short cystic duct.  The drain was brought out through the right lateral drain site.   The trocars were then removed.  I  infiltrated 30cc of 1/4% Marcaine into the incisions.  The umbilical port closed with a 0 Vicryl suture and the skin closed with 5-0 Monocryl.  The skin was painted with Dermabond.  The patient's sponge and needle count were correct.  The patient was transported to the RR in good condition.  Alphonsa Overall, MD, [facility] Surgery Pager: [phone] Office phone:  [phone]

## 2014-09-30 NOTE — Progress Notes (Signed)
Initial Nutrition Assessment  INTERVENTION:  Diet advancement per MD RD to follow-up with patient at a later date  NUTRITION DIAGNOSIS:  Increased nutrient needs related to wound healing (surgical healing) as evidenced by estimated needs.  GOAL:  Patient will meet greater than or equal to 90% of their needs  MONITOR:  Diet advancement, Labs, Weight trends, Skin, I & O's  REASON FOR ASSESSMENT:  Malnutrition Screening Tool    ASSESSMENT: 64 y.o. male who went put to eat last Friday night and had fried food. Severa lhours later, he developed severe epigastric and RUQ pain associated with nausea and vomiting. He vomited throughout the day on Saturday. Pt scheduled for lap chole today. Pt not in room at time of visit. Pt scheduled for ERCP tomorrow.  Per MST screen, pt reports losing 10 lb over the last 5 days (5% weight loss x 5 days, significant for time frame).  Unable to complete Nutrition-Focused physical exam at this time.   Labs reviewed: Elevated BUN & Creatinine  Height:  Ht Readings from Last 1 Encounters:  09/29/14 5\' 6"  (1.676 m)    Weight:  Wt Readings from Last 1 Encounters:  09/29/14 177 lb 1.9 oz (80.341 kg)    Ideal Body Weight:  64.5 kg  Wt Readings from Last 10 Encounters:  09/29/14 177 lb 1.9 oz (80.341 kg)  09/27/14 177 lb (80.287 kg)  09/26/14 171 lb (77.565 kg)  06/14/14 184 lb 6.4 oz (83.643 kg)  08/20/13 173 lb (78.472 kg)  02/23/13 170 lb (77.111 kg)  10/21/12 172 lb 8 oz (78.245 kg)  08/01/12 194 lb (87.998 kg)    BMI:  Body mass index is 28.6 kg/(m^2).  Estimated Nutritional Needs:  Kcal:  1900-2100  Protein:  90-100g  Fluid:  1.9L/day    Skin:  Reviewed, no issues  Diet Order:  Diet NPO time specified  EDUCATION NEEDS:  No education needs identified at this time   Intake/Output Summary (Last 24 hours) at 09/30/14 1121 Last data filed at 09/30/14 0949  Gross per 24 hour  Intake   1750 ml  Output   1250 ml   Net    500 ml    Last BM:  6/14  Clayton Bibles, MS, RD, LDN Pager: [phone] After Hours Pager: [phone]

## 2014-09-30 NOTE — Progress Notes (Signed)
Patient ID: [place], male   DOB: [birth year], 64 y.o.   MRN: [id number]    Subjective: Pt with some abdominal "soreness"   Running fevers and sweats.  No nausea  Objective: Vital signs in last 24 hours: Temp:  [100.1 F (37.8 C)-102.6 F (39.2 C)] 100.7 F (38.2 C) (06/16 0600) Pulse Rate:  [72-83] 79 (06/16 0600) Resp:  [16-18] 18 (06/16 0600) BP: (128-153)/(67-85) 140/85 mmHg (06/16 0600) SpO2:  [94 %-98 %] 94 % (06/16 0600) Weight:  [80.341 kg (177 lb 1.9 oz)] 80.341 kg (177 lb 1.9 oz) (06/15 1714) Last BM Date: 09/28/14  Intake/Output from previous day: 06/15 0701 - 06/16 0700 In: 1750 [I.V.:1700; IV Piggyback:50] Out: 950 [Urine:950] Intake/Output this shift: Total I/O In: -  Out: 200 [Urine:200]  PE: Heart: regular Lungs: CTAB Abd: soft, very tender in RUQ, hypoactive BS, ND  Lab Results:   Recent Labs  09/29/14 1722 09/30/14 0425  WBC 11.4* 10.8*  HGB 13.2 13.1  HCT 39.7 40.1  PLT 127* 124*   BMET  Recent Labs  09/29/14 1722 09/30/14 0425  NA 133* 136  K 4.1 4.4  CL 96* 95*  CO2 26 28  GLUCOSE 103* 137*  BUN 24* 22*  CREATININE 1.90* 1.75*  CALCIUM 9.0 9.2   PT/INR No results for input(s): LABPROT, INR in the last 72 hours. CMP     Component Value Date/Time   NA 136 09/30/2014 0425   K 4.4 09/30/2014 0425   CL 95* 09/30/2014 0425   CO2 28 09/30/2014 0425   GLUCOSE 137* 09/30/2014 0425   BUN 22* 09/30/2014 0425   CREATININE 1.75* 09/30/2014 0425   CREATININE 1.68* 03/09/2013 0745   CALCIUM 9.2 09/30/2014 0425   PROT 7.3 09/30/2014 0425   ALBUMIN 3.1* 09/30/2014 0425   AST 32 09/30/2014 0425   ALT 45 09/30/2014 0425   ALKPHOS 105 09/30/2014 0425   BILITOT 4.6* 09/30/2014 0425   GFRNONAA 40* 09/30/2014 0425   GFRAA 46* 09/30/2014 0425   Lipase     Component Value Date/Time   LIPASE 37 09/26/2014 1938       Studies/Results: US Abdomen Limited Ruq  09/29/2014   CLINICAL DATA:  Acute onset of nausea, vomiting, jaundice.  Gallbladder distention and stones noted on CT scan dated 09/26/2014.  EXAM: US ABDOMEN LIMITED - RIGHT UPPER QUADRANT  COMPARISON:  CT scan dated 09/26/2014  FINDINGS: Gallbladder:  There is extensive sludge in the gallbladder with what appears to be a 7 mm polyp on the gallbladder wall. The gallbladder wall is thickened to 4 mm. Positive sonographic Murphy's sign.  Common bile duct:  Diameter: No dilated intrahepatic bile ducts. Common bile duct is 4.3 mm in diameter.  Liver:  No focal lesion identified. Increased echogenicity around the hepatic veins which has been associated with hepatitis ("starry sky" appearance).  IMPRESSION: 1. Starry sky appearance of the hepatic parenchyma which has been associated with hepatitis. 2. Sludge in the gallbladder with a 7 mm polyp. Thickened gallbladder wall with positive sonographic Murphy's sign.   Electronically Signed   By: Lorriane Shire M.D.   On: 09/29/2014 18:47    Anti-infectives: Anti-infectives    Start     Dose/Rate Route Frequency Ordered Stop   09/29/14 1830  cefTRIAXone (ROCEPHIN) 1 g in dextrose 5 % 50 mL IVPB - Premix  Status:  Discontinued     1 g 100 mL/hr over 30 Minutes Intravenous Every 24 hours 09/29/14 1807 09/30/14 0856   09/29/14  1815  cefTRIAXone (ROCEPHIN) 2 g in dextrose 5 % 50 mL IVPB - Premix  Status:  Discontinued     2 g 100 mL/hr over 30 Minutes Intravenous Every 24 hours 09/29/14 1800 09/29/14 1807       Assessment/Plan  1. Acute cholecystitis with elevated TB -plan for OR today for lap possible open chole with IOC -if IOC + then GI will plan on ERCP tomorrow.  We greatly appreciate GI's evaluation and assistance with this patient -NPO -change rocephin to zosyn for better coverage as we suspect the patient is going to have a pretty infected gallbladder.  Will have pharmacy dose this as he does have some chronic kidney disease apparently.  Has seen nephrology in the past.  2. HTN -restart home meds 3. DVT proph -scds/  no chemical prophylaxis preop  LOS: 1 day    OSBORNE,KELLY E 09/30/2014, 8:57 AM Pager: [phone]   Agree with above. Appreciate Dr. Ardis Hughs input.   I discussed with the patient the indications and risks of gall bladder surgery.  The primary risks of gall bladder surgery include, but are not limited to, bleeding, infection, common bile duct injury, and open surgery.  There is also the risk that the patient may have continued symptoms after surgery.   We discussed the typical post-operative recovery course. I tried to answer the patient's questions.  Alphonsa Overall, MD, [facility] Pager: [phone] Office phone:  [phone]

## 2014-09-30 NOTE — Progress Notes (Signed)
Patient spiked a temp 102.2.  At 2300. Given  Tylenol given, Patient is already zosyn abx.   Notified on call MD.  No new orders.

## 2014-09-30 NOTE — Consult Note (Signed)
Referring Provider: Dr. Excell Seltzer, CCS Primary Care Physician:  Mickie Hillier, MD Primary Gastroenterologist:  unassigned  Reason for Consultation:   Abnormal LFTs   HPI: [place] is a 64 y.o. male who went put to eat last Friday night and had fried food. Severa lhours later, he developed severe epigastric and RUQ pain associated with nausea and vomiting. He vomited throughout the day on Saturday and on Sunday he went to the [facility] emergency room. At that time he had blood work that showed abnormal LFTs with an AST of 117, ALT of 1:30, total bili of 6.5. Alkaline phosphatase was normal. CBC was unremarkable he had a CAT scan that showed a distended gallbladder with mild thickening and a suggestion of stones within the gallbladder. No ductal dilatation was appreciated. He was discharged home and advised to follow up with his PCP the next day he continued to have discomfort and nausea and when evaluated by his PCP was found to have a bilirubin of 4. Hepatitis panel was done and was negative. Patient's wife reports that patient has been yellow since he began to feel ill Friday evening. Patient was admitted last evening and had an ultrasound that showed a "starry sky" appearance of the hepatic parenchyma which has been associated with hepatitis. Sludge in the gallbladder with a 7 mm polyp. Thickened gallbladder with a positive sonographic Murphy sign. Patient has not had this type of postprandial pain or persistence of pain in the past. He does have a history of GERD for which he has been on omeprazole for years. He states he had an EGD years ago by Dr. Corbin Ade in [place] and was found to have ulcers.   Past Medical History  Diagnosis Date  . Hypertension   . Hypercholesteremia   . PUD (peptic ulcer disease)   . Gout   . Reflux   . Impaired glucose metabolism   . Renal disorder     1 kidney that is working at 45 %.    Past Surgical History  Procedure Laterality Date  . Abdominal  surgery      Prior to Admission medications   Medication Sig Start Date End Date Taking? Authorizing Provider  acetaminophen (TYLENOL) 500 MG tablet Take 1,000 mg by mouth every 4 (four) hours as needed for fever.   Yes Historical Provider, MD  amLODipine (NORVASC) 10 MG tablet Take 1 tablet (10 mg total) by mouth daily. 06/14/14  Yes Mikey Kirschner, MD  aspirin 81 MG tablet Take 81 mg by mouth daily.   Yes Historical Provider, MD  bismuth subsalicylate (PEPTO BISMOL) 262 MG/15ML suspension Take 30 mLs by mouth every 6 (six) hours as needed for indigestion or diarrhea or loose stools.   Yes Historical Provider, MD  carvedilol (COREG) 6.25 MG tablet Take 1 tablet (6.25 mg total) by mouth 2 (two) times daily with a meal. 06/14/14  Yes Mikey Kirschner, MD  diphenhydrAMINE (BENADRYL) 25 MG tablet Take 25-50 mg by mouth at bedtime as needed for sleep.    Yes Historical Provider, MD  fenofibrate 160 MG tablet TAKE 1 TABLET BY MOUTH ONCE DAILY AT BEDTIME Patient taking differently: Take 160 mg by mouth at bedtime. TAKE 1 TABLET BY MOUTH ONCE DAILY AT BEDTIME 06/14/14  Yes Mikey Kirschner, MD  ibuprofen (ADVIL,MOTRIN) 200 MG tablet Take 400 mg by mouth 3 (three) times daily as needed for mild pain or moderate pain.   Yes Historical Provider, MD  omeprazole (PRILOSEC) 20 MG capsule  Take 20 mg by mouth daily as needed (for acid reflux).   Yes Historical Provider, MD  ondansetron (ZOFRAN) 4 mg TABS tablet Take 4 tablets by mouth every 8 (eight) hours as needed. Patient taking differently: Take 1 tablet by mouth every 8 (eight) hours as needed (nausea.).  09/26/14  Yes Virgel Manifold, MD  pravastatin (PRAVACHOL) 20 MG tablet Take 1 tablet (20 mg total) by mouth at bedtime. 06/14/14  Yes Mikey Kirschner, MD  azithromycin (ZITHROMAX Z-PAK) 250 MG tablet Take 2 tablets (500 mg) on  Day 1,  followed by 1 tablet (250 mg) once daily on Days 2 through 5. Patient not taking: Reported on 09/27/2014 06/14/14   Mikey Kirschner, MD    Current Facility-Administered Medications  Medication Dose Route Frequency Provider Last Rate Last Dose  . acetaminophen (TYLENOL) tablet 650 mg  650 mg Oral Q6H PRN Alphonsa Overall, MD   650 mg at 09/29/14 2208   Or  . acetaminophen (TYLENOL) suppository 650 mg  650 mg Rectal Q6H PRN Alphonsa Overall, MD      . antiseptic oral rinse (CPC / CETYLPYRIDINIUM CHLORIDE 0.05%) solution 7 mL  7 mL Mouth Rinse BID Alphonsa Overall, MD   7 mL at 09/29/14 2207  . cefTRIAXone (ROCEPHIN) 1 g in dextrose 5 % 50 mL IVPB - Premix  1 g Intravenous Q24H Alphonsa Overall, MD   1 g at 09/29/14 1909  . dextrose 5 % with KCl 20 mEq / L  infusion  20 mEq Intravenous Continuous Alphonsa Overall, MD 150 mL/hr at 09/30/14 0723 20 mEq at 09/30/14 0723  . diphenhydrAMINE (BENADRYL) injection 25 mg  25 mg Intravenous QHS PRN Autumn Messing III, MD      . HYDROcodone-acetaminophen (NORCO/VICODIN) 5-325 MG per tablet 1-2 tablet  1-2 tablet Oral Q4H PRN Alphonsa Overall, MD      . morphine 2 MG/ML injection 1-3 mg  1-3 mg Intravenous Q2H PRN Alphonsa Overall, MD      . ondansetron [facility]) injection 4 mg  4 mg Intravenous Q6H PRN Alphonsa Overall, MD        Allergies as of 09/29/2014  . (No Known Allergies)    Family History  Problem Relation Age of Onset  . Diabetes Mother   . Heart attack Mother   . Hypertension Sister     History   Social History  . Marital Status: Married    Spouse Name: N/A  . Number of Children: N/A  . Years of Education: N/A   Occupational History  . Not on file.   Social History Main Topics  . Smoking status: Former Smoker    Quit date: 08/01/1996  . Smokeless tobacco: Never Used  . Alcohol Use: No  . Drug Use: No  . Sexual Activity: Not on file   Other Topics Concern  . Not on file   Social History Narrative    Review of Systems: Gen: Has had fever CV: Denies chest pain, angina, palpitations, syncope, orthopnea, PND, peripheral edema, and claudication. Resp: Denies dyspnea at rest,  dyspnea with exercise, cough, sputum, wheezing, coughing up blood, and pleurisy. GI: Has nausea, vomiting and abdominal pain GU : Denies urinary burning, blood in urine, urinary frequency, urinary hesitancy, nocturnal urination, and urinary incontinence. MS: Denies joint pain, limitation of movement, and swelling, stiffness, low back pain, extremity pain. Denies muscle weakness, cramps, atrophy.  Derm: Denies rash, itching, dry skin, hives, moles, warts, or unhealing ulcers.  Psych: Denies depression, anxiety, memory loss,  suicidal ideation, hallucinations, paranoia, and confusion. Heme: Denies bruising, bleeding, and enlarged lymph nodes. Neuro:  Denies any headaches, dizziness, paresthesias. Endo:  Denies any problems with DM, thyroid, adrenal function.  Physical Exam: Vital signs in last 24 hours: Temp:  [100.1 F (37.8 C)-102.6 F (39.2 C)] 100.7 F (38.2 C) (06/16 0600) Pulse Rate:  [72-83] 79 (06/16 0600) Resp:  [16-18] 18 (06/16 0600) BP: (128-153)/(67-85) 140/85 mmHg (06/16 0600) SpO2:  [94 %-98 %] 94 % (06/16 0600) Weight:  [177 lb 1.9 oz (80.341 kg)] 177 lb 1.9 oz (80.341 kg) (06/15 1714) Last BM Date: 09/28/14 General:   Alert,  Well-developed, well-nourished, pleasant and cooperative in NAD Head:  Normocephalic and atraumatic. Eyes:  Sclera clear, no icterus.   Conjunctiva pink. Ears:  Normal auditory acuity. Nose:  No deformity, discharge,  or lesions. Mouth:  No deformity or lesions.   Neck:  Supple; no masses or thyromegaly. Lungs:  Clear throughout to auscultation.   No wheezes, crackles, or rhonchi . Heart:  Regular rate and rhythm; no murmurs, clicks, rubs,  or gallops. Abdomen:  Soft, nondistended, tender to palpation right upper quadrant and epigastric area BS active,nonpalp mass or hsm.   Rectal:  Deferred  Msk:  Symmetrical without gross deformities. . Pulses:  Normal pulses noted. Extremities: Without clubbing or edema. Neurologic: Alert and  oriented x4;   grossly normal neurologically. Skin:  Intact without significant lesions or rashes.. Psych:  Alert and cooperative. Normal mood and affect.  Intake/Output from previous day: 06/15 0701 - 06/16 0700 In: 1750 [I.V.:1700; IV Piggyback:50] Out: 950 [Urine:950] Intake/Output this shift: Total I/O In: -  Out: 200 [Urine:200]  Lab Results:  Recent Labs  09/29/14 1722 09/30/14 0425  WBC 11.4* 10.8*  HGB 13.2 13.1  HCT 39.7 40.1  PLT 127* 124*   BMET  Recent Labs  09/29/14 1722 09/30/14 0425  NA 133* 136  K 4.1 4.4  CL 96* 95*  CO2 26 28  GLUCOSE 103* 137*  BUN 24* 22*  CREATININE 1.90* 1.75*  CALCIUM 9.0 9.2   LFT  Recent Labs  09/30/14 0425  PROT 7.3  ALBUMIN 3.1*  AST 32  ALT 45  ALKPHOS 105  BILITOT 4.6*    Studies/Results: US Abdomen Limited Ruq  09/29/2014   CLINICAL DATA:  Acute onset of nausea, vomiting, jaundice. Gallbladder distention and stones noted on CT scan dated 09/26/2014.  EXAM: US ABDOMEN LIMITED - RIGHT UPPER QUADRANT  COMPARISON:  CT scan dated 09/26/2014  FINDINGS: Gallbladder:  There is extensive sludge in the gallbladder with what appears to be a 7 mm polyp on the gallbladder wall. The gallbladder wall is thickened to 4 mm. Positive sonographic Murphy's sign.  Common bile duct:  Diameter: No dilated intrahepatic bile ducts. Common bile duct is 4.3 mm in diameter.  Liver:  No focal lesion identified. Increased echogenicity around the hepatic veins which has been associated with hepatitis ("starry sky" appearance).  IMPRESSION: 1. Starry sky appearance of the hepatic parenchyma which has been associated with hepatitis. 2. Sludge in the gallbladder with a 7 mm polyp. Thickened gallbladder wall with positive sonographic Murphy's sign.   Electronically Signed   By: Lorriane Shire M.D.   On: 09/29/2014 18:47   Abdomen Pelvis Wo Contrast   Status: Final result       PACS Images     Show images for CT Abdomen Pelvis Wo Contrast     Study  Result     CLINICAL DATA: Acute onset  of nausea, vomiting and jaundice. Initial encounter.  EXAM: CT ABDOMEN AND PELVIS WITHOUT CONTRAST  TECHNIQUE: Multidetector CT imaging of the abdomen and pelvis was performed following the standard protocol without IV contrast.  COMPARISON: Renal ultrasound performed 11/30/2011  FINDINGS: Minimal bibasilar atelectasis is noted. Scattered coronary artery calcifications are seen.  The gallbladder is somewhat distended, with mild gallbladder wall thickening. There is suggestion of stones within the gallbladder, though this is not well seen. The common bile duct and intrahepatic biliary ducts remain grossly normal in caliber, though difficult to fully assess without contrast. This raises concern for obstruction at the level of the cystic duct, given the patient's elevated bilirubin.  The liver is unremarkable in appearance. The spleen is enlarged, measuring 14.7 cm in length. Vague central areas of decreased attenuation within the spleen are nonspecific and may simply reflect extension of hilar fat. The pancreas and adrenal glands are unremarkable.  There is marked chronic atrophy of the right kidney, with mild associated calcification. There is compensatory hypertrophy of the left kidney, with underlying scarring. A small calcification at the left renal hilum is thought to be vascular in nature. No renal or ureteral stones are identified. Minimal nonspecific perinephric stranding is noted on the left. There is no evidence of hydronephrosis.  No free fluid is identified. The small bowel is unremarkable in appearance. The stomach is within normal limits. No acute vascular abnormalities are seen. Scattered calcification is seen along the abdominal aorta and its branches.  The appendix is normal in caliber, without evidence for appendicitis. The colon is unremarkable in appearance.  The bladder is moderately distended and  grossly unremarkable. The prostate remains normal in size, with scattered calcification. No inguinal lymphadenopathy is seen.  No acute osseous abnormalities are identified.  IMPRESSION: 1. Gallbladder somewhat distended, with mild gallbladder wall thickening. Suggestion of stones within the gallbladder, though this is not well seen on CT. The common bile duct and intrahepatic biliary ducts remain grossly normal in caliber, though difficult to fully assess without contrast. This raises concern for obstruction at the level of the cystic duct, given the patient's elevated bilirubin. 2. Splenomegaly noted. Vague central areas of decreased attenuation at the spleen are nonspecific and may reflect normal hilar fat. 3. Scattered coronary artery calcifications seen. 4. Marked chronic atrophy of the right kidney, with mild associated calcification. Compensatory hypertrophy of the left kidney, with underlying scarring. 5. Scattered calcification along the abdominal aorta and its branches.   Electronically Signed  By: Garald Balding M.D.  On: 09/26/2014 22:04     IMPRESSION/PLAN: 64 year old male admitted with a six-day history of right upper quadrant pain and fever with persistent elevation of bilirubin referred for evaluation. Imaging is consistent with cholecystitis and question of common duct stone. Would proceed with cholecystectomy and intraoperative cholangiogram which would provide an answer as to if there is a stone in the duct. Will tentatively schedule ERCP for tomorrow but if cholangiogram is normal will  cancel ERCP. ( Can also consider MRCP today if surgery prefers).Will review with Dr Ardis Hughs.   Hvozdovic, Deloris Ping 09/30/2014,  Pager [phone]   ________________________________________________________________________  Velora Heckler GI MD note:  I personally examined the patient, reviewed the data and agree with the assessment and plan described above.  Difficult to know  at this point if CBD pathology.  He will have lap chole today with IOC and if that shows CBD stone go ahead with ERCP tomorrow AM, already on schedule.   Owens Loffler, MD [place]  [facility] Pager [phone]

## 2014-09-30 NOTE — Anesthesia Procedure Notes (Signed)
Procedure Name: Intubation Date/Time: 09/30/2014 11:48 AM Performed by: Carleene Cooper A Pre-anesthesia Checklist: Patient identified, Emergency Drugs available, Suction available, Patient being monitored and Timeout performed Patient Re-evaluated:Patient Re-evaluated prior to inductionOxygen Delivery Method: Circle system utilized Preoxygenation: Pre-oxygenation with 100% oxygen Intubation Type: Rapid sequence, Cricoid Pressure applied and IV induction Laryngoscope Size: Mac and 4 Grade View: Grade I Tube type: Oral (RSI with cricoid pressure by Dr. Glennon Mac) Tube size: 7.5 mm Number of attempts: 1 Airway Equipment and Method: Stylet Placement Confirmation: ETT inserted through vocal cords under direct vision,  positive ETCO2 and breath sounds checked- equal and bilateral Secured at: 22 cm Tube secured with: Tape Dental Injury: Teeth and Oropharynx as per pre-operative assessment

## 2014-09-30 NOTE — Transfer of Care (Signed)
Immediate Anesthesia Transfer of Care Note  Patient: [place]  Procedure(s) Performed: Procedure(s): LAPAROSCOPIC CHOLECYSTECTOMY WITH INTRAOPERATIVE CHOLANGIOGRAM (N/A)  Patient Location: PACU  Anesthesia Type:General  Level of Consciousness: awake, alert , oriented and patient cooperative  Airway & Oxygen Therapy: Patient Spontanous Breathing and Patient connected to face mask oxygen  Post-op Assessment: Report given to RN, Post -op Vital signs reviewed and stable and Patient moving all extremities  Post vital signs: Reviewed and stable  Last Vitals:  Filed Vitals:   09/30/14 0948  BP: 127/73  Pulse: 75  Temp: 37.5 C  Resp: 16    Complications: No apparent anesthesia complications

## 2014-09-30 NOTE — Anesthesia Postprocedure Evaluation (Signed)
  Anesthesia Post-op Note  Patient: Hunter [place]  Procedure(s) Performed: Procedure(s): LAPAROSCOPIC CHOLECYSTECTOMY WITH INTRAOPERATIVE CHOLANGIOGRAM (N/A)  Patient Location: PACU  Anesthesia Type:General  Level of Consciousness: awake, alert , oriented and patient cooperative  Airway and Oxygen Therapy: Patient Spontanous Breathing  Post-op Pain: mild  Post-op Assessment: Post-op Vital signs reviewed, Patient's Cardiovascular Status Stable, Respiratory Function Stable, Patent Airway, No signs of Nausea or vomiting and Pain level controlled              Post-op Vital Signs: Reviewed and stable  Last Vitals:  Filed Vitals:   09/30/14 1445  BP: 147/71  Pulse: 76  Temp: 36.7 C  Resp: 17    Complications: No apparent anesthesia complications

## 2014-10-01 ENCOUNTER — Encounter (HOSPITAL_COMMUNITY): Admission: AD | Disposition: A | Discharge status: Home / Self Care | Payer: Self-pay | Source: Ambulatory Visit

## 2014-10-01 ENCOUNTER — Encounter (HOSPITAL_COMMUNITY): Payer: Self-pay | Admitting: Surgery

## 2014-10-01 LAB — COMPREHENSIVE METABOLIC PANEL
ALK PHOS: 95 U/L (ref 38–126)
ALT: 57 U/L (ref 17–63)
AST: 83 U/L — ABNORMAL HIGH (ref 15–41)
Albumin: 2.5 g/dL — ABNORMAL LOW (ref 3.5–5.0)
Anion gap: 8 (ref 5–15)
BILIRUBIN TOTAL: 3.2 mg/dL — AB (ref 0.3–1.2)
BUN: 19 mg/dL (ref 6–20)
CO2: 29 mmol/L (ref 22–32)
Calcium: 8.4 mg/dL — ABNORMAL LOW (ref 8.9–10.3)
Chloride: 96 mmol/L — ABNORMAL LOW (ref 101–111)
Creatinine, Ser: 1.6 mg/dL — ABNORMAL HIGH (ref 0.61–1.24)
GFR calc non Af Amer: 44 mL/min — ABNORMAL LOW (ref >60)
GFR, EST AFRICAN AMERICAN: 51 mL/min — AB (ref >60)
GLUCOSE: 117 mg/dL — AB (ref 65–99)
Potassium: 4.5 mmol/L (ref 3.5–5.1)
Sodium: 133 mmol/L — ABNORMAL LOW (ref 135–145)
Total Protein: 6.3 g/dL — ABNORMAL LOW (ref 6.5–8.1)

## 2014-10-01 LAB — CBC
HCT: 34.6 % — ABNORMAL LOW (ref 39.0–52.0)
Hemoglobin: 11.4 g/dL — ABNORMAL LOW (ref 13.0–17.0)
MCH: 29 pg (ref 26.0–34.0)
MCHC: 32.9 g/dL (ref 30.0–36.0)
MCV: 88 fL (ref 78.0–100.0)
PLATELETS: 151 10*3/uL (ref 150–400)
RBC: 3.93 MIL/uL — ABNORMAL LOW (ref 4.22–5.81)
RDW: 13.9 % (ref 11.5–15.5)
WBC: 7.1 10*3/uL (ref 4.0–10.5)

## 2014-10-01 SURGERY — ERCP, WITH INTERVENTION IF INDICATED
Anesthesia: General

## 2014-10-01 MED ORDER — ENOXAPARIN SODIUM 40 MG/0.4ML ~~LOC~~ SOLN
40.0000 mg | Freq: Every day | SUBCUTANEOUS | Status: DC
  Administered 2014-10-01 – 2014-10-02 (×2): 40 mg via SUBCUTANEOUS
  Filled 2014-10-01 (×2): qty 0.4

## 2014-10-01 NOTE — Progress Notes (Signed)
Patient ID: Hunter Franklin, male   DOB: 04-09-1951, 64 y.o.   MRN: [id number] 1 Day Post-Op  Subjective: Pt having some pain this morning, but ate some liquids for breakfast.  No nausea.  Passing a small amount of flatus  Objective: Vital signs in last 24 hours: Temp:  [98.1 F (36.7 C)-102.2 F (39 C)] 99.1 F (37.3 C) (06/17 0800) Pulse Rate:  [70-84] 78 (06/17 0800) Resp:  [12-21] 18 (06/17 0800) BP: (111-159)/(53-78) 120/63 mmHg (06/17 0800) SpO2:  [95 %-100 %] 98 % (06/17 0800) Last BM Date: 09/28/14  Intake/Output from previous day: 06/16 0701 - 06/17 0700 In: 6106.3 [P.O.:1800; I.V.:4206.3; IV Piggyback:100] Out: 3185 [Urine:3050; Drains:110; Blood:25] Intake/Output this shift:    PE: Abd: soft, appropriately tender, some BS, ND, incisions c/d/i, JP drain with serosang output Heart: regular Lungs: CTAB  Lab Results:   Recent Labs  09/30/14 0425 10/01/14 0525  WBC 10.8* 7.1  HGB 13.1 11.4*  HCT 40.1 34.6*  PLT 124* 151   BMET  Recent Labs  09/30/14 0425 10/01/14 0523  NA 136 133*  K 4.4 4.5  CL 95* 96*  CO2 28 29  GLUCOSE 137* 117*  BUN 22* 19  CREATININE 1.75* 1.60*  CALCIUM 9.2 8.4*   PT/INR No results for input(s): LABPROT, INR in the last 72 hours. CMP     Component Value Date/Time   NA 133* 10/01/2014 0523   K 4.5 10/01/2014 0523   CL 96* 10/01/2014 0523   CO2 29 10/01/2014 0523   GLUCOSE 117* 10/01/2014 0523   BUN 19 10/01/2014 0523   CREATININE 1.60* 10/01/2014 0523   CREATININE 1.68* 03/09/2013 0745   CALCIUM 8.4* 10/01/2014 0523   PROT 6.3* 10/01/2014 0523   ALBUMIN 2.5* 10/01/2014 0523   AST 83* 10/01/2014 0523   ALT 57 10/01/2014 0523   ALKPHOS 95 10/01/2014 0523   BILITOT 3.2* 10/01/2014 0523   GFRNONAA 44* 10/01/2014 0523   GFRAA 51* 10/01/2014 0523   Lipase     Component Value Date/Time   LIPASE 37 09/26/2014 1938       Studies/Results: Dg Cholangiogram Operative  09/30/2014   CLINICAL DATA:  64 year old male  with cholelithiasis undergoing laparoscopic cholecystectomy.  EXAM: INTRAOPERATIVE CHOLANGIOGRAM  TECHNIQUE: Cholangiographic images from the C-arm fluoroscopic device were submitted for interpretation post-operatively. Please see the procedural report for the amount of contrast and the fluoroscopy time utilized.  COMPARISON:  Abdominal ultrasound 09/29/2014  FINDINGS: Cine clip obtained during intraoperative cholangiogram at the time of laparoscopic cholecystectomy demonstrates cannulation of the cystic duct remnant with opacification of the intra and extrahepatic biliary tree. No evidence of biliary dilatation, stenosis, stricture, or choledocholithiasis. Contrast material passes freely through the ampulla and into the duodenum.  IMPRESSION: Negative intraoperative cholangiogram.   Electronically Signed   By: Jacqulynn Cadet M.D.   On: 09/30/2014 13:25   US Abdomen Limited Ruq  09/29/2014   CLINICAL DATA:  Acute onset of nausea, vomiting, jaundice. Gallbladder distention and stones noted on CT scan dated 09/26/2014.  EXAM: US ABDOMEN LIMITED - RIGHT UPPER QUADRANT  COMPARISON:  CT scan dated 09/26/2014  FINDINGS: Gallbladder:  There is extensive sludge in the gallbladder with what appears to be a 7 mm polyp on the gallbladder wall. The gallbladder wall is thickened to 4 mm. Positive sonographic Murphy's sign.  Common bile duct:  Diameter: No dilated intrahepatic bile ducts. Common bile duct is 4.3 mm in diameter.  Liver:  No focal lesion identified. Increased echogenicity around  the hepatic veins which has been associated with hepatitis ("starry sky" appearance).  IMPRESSION: 1. Starry sky appearance of the hepatic parenchyma which has been associated with hepatitis. 2. Sludge in the gallbladder with a 7 mm polyp. Thickened gallbladder wall with positive sonographic Murphy's sign.   Electronically Signed   By: Lorriane Shire M.D.   On: 09/29/2014 18:47    Anti-infectives: Anti-infectives    Start      Dose/Rate Route Frequency Ordered Stop   09/30/14 1000  piperacillin-tazobactam (ZOSYN) IVPB 3.375 g     3.375 g 12.5 mL/hr over 240 Minutes Intravenous Every 8 hours 09/30/14 0903     09/29/14 1830  cefTRIAXone (ROCEPHIN) 1 g in dextrose 5 % 50 mL IVPB - Premix  Status:  Discontinued     1 g 100 mL/hr over 30 Minutes Intravenous Every 24 hours 09/29/14 1807 09/30/14 0856   09/29/14 1815  cefTRIAXone (ROCEPHIN) 2 g in dextrose 5 % 50 mL IVPB - Premix  Status:  Discontinued     2 g 100 mL/hr over 30 Minutes Intravenous Every 24 hours 09/29/14 1800 09/29/14 1807      Assessment/Plan   1. POD 1, s/p lap chole with -IOC - 09/30/2014 - D. Deny Chevez   for empyema of the gallbladder with elevated TB  -advance diet today  -leave on zosyn.  He will need 5-7 days of abx therapy due to empyema of the gallbladder  -his drain will need to stay in at discharge.  I will set up a follow up visit with Dr. Lucia Gaskins next week per his request -mobilize and pulm toilet -recheck LFTs in am, trending down.  No need for ERCP at this time.  Appreciate GI evaluation. 2. HTN -on home meds 3. DVT proph -scds/lovenox 4. H/o some kidney dysfunction -creatinine down, will follow  LOS: 2 days    OSBORNE,KELLY E 10/01/2014, 9:11 AM Pager: [phone]  Agree with above.  Alphonsa Overall, MD, [facility] Pager: [phone] Office phone:  [phone]

## 2014-10-01 NOTE — Progress Notes (Signed)
[facility] Progress Note    Since last GI note: Lap chole yesterday; acute cholecystitis (GB empyema); IOC normal per surgeon and radiologist.  Objective: Vital signs in last 24 hours: Temp:  [98.1 F (36.7 C)-102.2 F (39 C)] 99.5 F (37.5 C) (06/17 0540) Pulse Rate:  [70-84] 74 (06/17 0540) Resp:  [12-21] 18 (06/17 0540) BP: (111-159)/(53-78) 127/68 mmHg (06/17 0540) SpO2:  [95 %-100 %] 97 % (06/17 0540) Last BM Date: 09/28/14 General: alert and oriented times 3 Heart: regular rate and rythm Abdomen: soft, appropriately tender post-op day 1, non-distended, normal bowel sounds   Lab Results:  Recent Labs  09/29/14 1722 09/30/14 0425 10/01/14 0525  WBC 11.4* 10.8* 7.1  HGB 13.2 13.1 11.4*  PLT 127* 124* 151  MCV 87.4 86.4 88.0    Recent Labs  09/29/14 1722 09/30/14 0425 10/01/14 0523  NA 133* 136 133*  K 4.1 4.4 4.5  CL 96* 95* 96*  CO2 26 28 29   GLUCOSE 103* 137* 117*  BUN 24* 22* 19  CREATININE 1.90* 1.75* 1.60*  CALCIUM 9.0 9.2 8.4*    Recent Labs  09/29/14 1722 09/30/14 0425 10/01/14 0523  PROT 7.1 7.3 6.3*  ALBUMIN 3.2* 3.1* 2.5*  AST 31 32 83*  ALT 50 45 57  ALKPHOS 94 105 95  BILITOT 5.4* 4.6* 3.2*   No results for input(s): INR in the last 72 hours.   Studies/Results: Dg Cholangiogram Operative  09/30/2014   CLINICAL DATA:  64 year old male with cholelithiasis undergoing laparoscopic cholecystectomy.  EXAM: INTRAOPERATIVE CHOLANGIOGRAM  TECHNIQUE: Cholangiographic images from the C-arm fluoroscopic device were submitted for interpretation post-operatively. Please see the procedural report for the amount of contrast and the fluoroscopy time utilized.  COMPARISON:  Abdominal ultrasound 09/29/2014  FINDINGS: Cine clip obtained during intraoperative cholangiogram at the time of laparoscopic cholecystectomy demonstrates cannulation of the cystic duct remnant with opacification of the intra and extrahepatic biliary tree. No evidence  of biliary dilatation, stenosis, stricture, or choledocholithiasis. Contrast material passes freely through the ampulla and into the duodenum.  IMPRESSION: Negative intraoperative cholangiogram.   Electronically Signed   By: Jacqulynn Cadet M.D.   On: 09/30/2014 13:25   US Abdomen Limited Ruq  09/29/2014   CLINICAL DATA:  Acute onset of nausea, vomiting, jaundice. Gallbladder distention and stones noted on CT scan dated 09/26/2014.  EXAM: US ABDOMEN LIMITED - RIGHT UPPER QUADRANT  COMPARISON:  CT scan dated 09/26/2014  FINDINGS: Gallbladder:  There is extensive sludge in the gallbladder with what appears to be a 7 mm polyp on the gallbladder wall. The gallbladder wall is thickened to 4 mm. Positive sonographic Murphy's sign.  Common bile duct:  Diameter: No dilated intrahepatic bile ducts. Common bile duct is 4.3 mm in diameter.  Liver:  No focal lesion identified. Increased echogenicity around the hepatic veins which has been associated with hepatitis ("starry sky" appearance).  IMPRESSION: 1. Starry sky appearance of the hepatic parenchyma which has been associated with hepatitis. 2. Sludge in the gallbladder with a 7 mm polyp. Thickened gallbladder wall with positive sonographic Murphy's sign.   Electronically Signed   By: Lorriane Shire M.D.   On: 09/29/2014 18:47     Medications: Scheduled Meds: . amLODipine  10 mg Oral Daily  . antiseptic oral rinse  7 mL Mouth Rinse BID  . carvedilol  6.25 mg Oral BID WC  . pantoprazole  40 mg Oral Daily  . piperacillin-tazobactam (ZOSYN)  IV  3.375 g Intravenous Q8H   Continuous  Infusions: . dextrose 5 % with KCl 20 mEq / L 20 mEq (09/30/14 1709)   PRN Meds:.acetaminophen **OR** acetaminophen, diphenhydrAMINE, HYDROcodone-acetaminophen, morphine injection, ondansetron    Assessment/Plan: 64 y.o. male acute cholecystitis, POD 1 s/p lap chole with negative IOC  Fever this morning, I encouraged spirometry, ambulation.  Will leave diet change to  general surgery.  LFTs normal normal but improving and IOC was negative so I suspect he has no retained biliary stones.  No need for ERCP.    Please call, page with any further questions or concerns. I am covering this weekend.    Milus Banister, MD  10/01/2014, 7:12 AM [facility] Pager [phone]

## 2014-10-01 NOTE — Discharge Instructions (Signed)
Bulb Drain Home Care °A bulb drain consists of a thin rubber tube and a soft, round bulb that creates a gentle suction. The rubber tube is placed in the area where you had surgery. A bulb is attached to the end of the tube that is outside the body. The bulb drain removes excess fluid that normally builds up in a surgical wound after surgery. The color and amount of fluid will vary. Immediately after surgery, the fluid is bright red and is a little thicker than water. It may gradually change to a yellow or pink color and become more thin and water-like. When the amount decreases to about 1 or 2 tbsp in 24 hours, your health care provider will usually remove it. °DAILY CARE °· Keep the bulb flat (compressed) at all times, except while emptying it. The flatness creates suction. You can flatten the bulb by squeezing it firmly in the middle and then closing the cap. °· Keep sites where the tube enters the skin dry and covered with a bandage (dressing). °· Secure the tube 1-2 in (2.5-5.1 cm) below the insertion sites to keep it from pulling on your stitches. The tube is stitched in place and will not slip out. °· Secure the bulb as directed by your health care provider. °· For the first 3 days after surgery, there usually is more fluid in the bulb. Empty the bulb whenever it becomes half full because the bulb does not create enough suction if it is too full. The bulb could also overflow. Write down how much fluid you remove each time you empty your drain. Add up the amount removed in 24 hours. °· Empty the bulb at the same time every day once the amount of fluid decreases and you only need to empty it once a day. Write down the amounts and the 24-hour totals to give to your health care provider. This helps your health care provider know when the tubes can be removed. °EMPTYING THE BULB DRAIN °Before emptying the bulb, get a measuring cup, a piece of paper and a pen, and wash your hands. °· Gently run your fingers down the  tube (stripping) to empty any drainage from the tubing into the bulb. This may need to be done several times a day to clear the tubing of clots and tissue. °· Open the bulb cap to release suction, which causes it to inflate. Do not touch the inside of the cap. °· Gently run your fingers down the tube (stripping) to empty any drainage from the tubing into the bulb. °· Hold the cap out of the way, and pour fluid into the measuring cup.   °· Squeeze the bulb to provide suction.  °· Replace the cap.   °· Check the tape that holds the tube to your skin. If it is becoming loose, you can remove the loose piece of tape and apply a new one. Then, pin the bulb to your shirt.   °· Write down the amount of fluid you emptied out. Write down the date and each time you emptied your bulb drain. (If there are 2 bulbs, note the amount of drainage from each bulb and keep the totals separate. Your health care provider will want to know the total amounts for each drain and which tube is draining more.)   °· Flush the fluid down the toilet and wash your hands.   °· Call your health care provider once you have less than 2 tbsp of fluid collecting in the bulb drain every 24 hours. °If   there is drainage around the tube site, change dressings and keep the area dry. Cleanse around tube with sterile saline and place dry gauze around site. This gauze should be changed when it is soiled. If it stays clean and unsoiled, it should still be changed daily.  °SEEK MEDICAL CARE IF: °· Your drainage has a bad smell or is cloudy.   °· You have a fever.   °· Your drainage is increasing instead of decreasing.   °· Your tube fell out.   °· You have redness or swelling around the tube site.   °· You have drainage from a surgical wound.   °· Your bulb drain will not stay flat after you empty it.   °MAKE SURE YOU:  °· Understand these instructions. °· Will watch your condition. °· Will get help right away if you are not doing well or get worse. °Document  Released: 03/30/2000 Document Revised: 08/17/2013 Document Reviewed: 09/05/2011 °Exit[employer]® Patient Information ©2015 [employer]. This information is not intended to replace advice given to you by your health care provider. Make sure you discuss any questions you have with your health care provider. ° °CCS ______[facility] °LAPAROSCOPIC SURGERY: POST OP INSTRUCTIONS °Always review your discharge instruction sheet given to you by the facility where your surgery was performed. °IF YOU HAVE DISABILITY OR FAMILY LEAVE FORMS, YOU MUST BRING THEM TO THE OFFICE FOR PROCESSING.   °DO NOT GIVE THEM TO YOUR DOCTOR. ° °1. A prescription for pain medication may be given to you upon discharge.  Take your pain medication as prescribed, if needed.  If narcotic pain medicine is not needed, then you may take acetaminophen (Tylenol) or ibuprofen (Advil) as needed. °2. Take your usually prescribed medications unless otherwise directed. °3. If you need a refill on your pain medication, please contact your pharmacy.  They will contact our office to request authorization. Prescriptions will not be filled after 5pm or on week-ends. °4. You should follow a light diet the first few days after arrival home, such as soup and crackers, etc.  Be sure to include lots of fluids daily. °5. Most patients will experience some swelling and bruising in the area of the incisions.  Ice packs will help.  Swelling and bruising can take several days to resolve.  °6. It is common to experience some constipation if taking pain medication after surgery.  Increasing fluid intake and taking a stool softener (such as Colace) will usually help or prevent this problem from occurring.  A mild laxative (Milk of Magnesia or Miralax) should be taken according to package instructions if there are no bowel movements after 48 hours. °7. Unless discharge instructions indicate otherwise, you may remove your bandages 24-48 hours after surgery, and you  may shower at that time.  You may have steri-strips (small skin tapes) in place directly over the incision.  These strips should be left on the skin for 7-10 days.  If your surgeon used skin glue on the incision, you may shower in 24 hours.  The glue will flake off over the next 2-3 weeks.  Any sutures or staples will be removed at the office during your follow-up visit. °8. ACTIVITIES:  You may resume regular (light) daily activities beginning the next day--such as daily self-care, walking, climbing stairs--gradually increasing activities as tolerated.  You may have sexual intercourse when it is comfortable.  Refrain from any heavy lifting or straining until approved by your doctor. °a. You may drive when you are no longer taking prescription pain medication, you   can comfortably wear a seatbelt, and you can safely maneuver your car and apply brakes. °b. RETURN TO WORK:  __________________________________________________________ °9. You should see your doctor in the office for a follow-up appointment approximately 2-3 weeks after your surgery.  Make sure that you call for this appointment within a day or two after you arrive home to insure a convenient appointment time. °10. OTHER INSTRUCTIONS: __________________________________________________________________________________________________________________________ __________________________________________________________________________________________________________________________ °WHEN TO CALL YOUR DOCTOR: °1. Fever over 101.0 °2. Inability to urinate °3. Continued bleeding from incision. °4. Increased pain, redness, or drainage from the incision. °5. Increasing abdominal pain ° °The clinic staff is available to answer your questions during regular business hours.  Please don’t hesitate to call and ask to speak to one of the nurses for clinical concerns.  If you have a medical emergency, go to the nearest emergency room or call 911.  A surgeon from [facility] is always on call at the hospital. °[address], [address], [place], [place]  [address] ? [address] [address], [place], [place]   [address] °[phone] ? [phone] ? FAX [phone] °Web site: [email or web address] ° °

## 2014-10-02 LAB — CBC
HEMATOCRIT: 34.8 % — AB (ref 39.0–52.0)
HEMOGLOBIN: 11.2 g/dL — AB (ref 13.0–17.0)
MCH: 28.6 pg (ref 26.0–34.0)
MCHC: 32.2 g/dL (ref 30.0–36.0)
MCV: 88.8 fL (ref 78.0–100.0)
Platelets: 202 10*3/uL (ref 150–400)
RBC: 3.92 MIL/uL — AB (ref 4.22–5.81)
RDW: 14.1 % (ref 11.5–15.5)
WBC: 8.6 10*3/uL (ref 4.0–10.5)

## 2014-10-02 LAB — COMPREHENSIVE METABOLIC PANEL
ALT: 53 U/L (ref 17–63)
ANION GAP: 11 (ref 5–15)
AST: 66 U/L — ABNORMAL HIGH (ref 15–41)
Albumin: 2.5 g/dL — ABNORMAL LOW (ref 3.5–5.0)
Alkaline Phosphatase: 143 U/L — ABNORMAL HIGH (ref 38–126)
BUN: 20 mg/dL (ref 6–20)
CALCIUM: 8.6 mg/dL — AB (ref 8.9–10.3)
CO2: 29 mmol/L (ref 22–32)
Chloride: 96 mmol/L — ABNORMAL LOW (ref 101–111)
Creatinine, Ser: 1.56 mg/dL — ABNORMAL HIGH (ref 0.61–1.24)
GFR, EST AFRICAN AMERICAN: 53 mL/min — AB (ref >60)
GFR, EST NON AFRICAN AMERICAN: 46 mL/min — AB (ref >60)
GLUCOSE: 109 mg/dL — AB (ref 65–99)
POTASSIUM: 4.3 mmol/L (ref 3.5–5.1)
Sodium: 136 mmol/L (ref 135–145)
Total Bilirubin: 2.1 mg/dL — ABNORMAL HIGH (ref 0.3–1.2)
Total Protein: 6.7 g/dL (ref 6.5–8.1)

## 2014-10-02 MED ORDER — HYDROCODONE-ACETAMINOPHEN 5-325 MG PO TABS: 1.0000 | ORAL_TABLET | ORAL | Status: DC | PRN

## 2014-10-02 NOTE — Progress Notes (Signed)
Assessment unchanged. Pt and wife verbalized understanding of dc instructions through teach back. Wife taught drain care with demonstrated understanding of how to empty and recharge bulb. Supplies and drain record sheet provided. Dressing changed at drain site. Wife observed and verbalized understanding. Drain sponges and tape provided. Discharged via wc to front entrance to meet awaiting vehicle to carry home. Accompanied by wife and NT.

## 2014-10-02 NOTE — Discharge Summary (Signed)
Physician Discharge Summary [facility]  Patient ID: Hunter Franklin MRN: [id number] DOB/AGE: 09/02/1950 64 y.o.  Admit date: 09/29/2014 Discharge date: 10/02/2014  Admission Diagnoses:  Cholecystitis, cholelithiasis  Discharge Diagnoses:  Active Problems:   Gall bladder disease   Discharged Condition: good  Hospital Course: Patient was admitted for observation following gallbladder surgery.  Post op course was uncomplicated.  Pain was well controlled.  Tolerated diet.  Patient and wife instructed in drain care.  Patient was prepared for discharge home on POD#2.  Consults: None  Treatments: surgery: lap chole with IOC  Discharge Exam: Blood pressure 125/72, pulse 71, temperature 99.2 F (37.3 C), temperature source Oral, resp. rate 16, height 5\' 6"  (1.676 m), weight 80.341 kg (177 lb 1.9 oz), SpO2 96 %. HEENT - clear Neck - soft Chest - clear bilaterally Cor - RRR Abd - wound dry and intact with Dermabond; drain with thin serous output, minimal  Disposition: Home  Discharge Instructions    Change dressing (specify)    Complete by:  As directed   Change dressing once daily.  May shower.  Drain care as instructed.     Diet - low sodium heart healthy    Complete by:  As directed      Discharge instructions    Complete by:  As directed   [facility]  LAPAROSCOPIC SURGERY:  POST-OP INSTRUCTIONS  Always review your discharge instruction sheet given to you by the facility where your surgery was performed.  A prescription for pain medication may be given to you upon discharge.  Take your pain medication as prescribed.  If narcotic pain medicine is not needed, then you may take acetaminophen (Tylenol) or ibuprofen (Advil) as needed.  Take your usually prescribed medications unless otherwise directed.  If you need a refill on your pain medication, please contact your pharmacy.  They will contact our office to request authorization.  Prescriptions will not be filled after 5 P.M. or on weekends.  You should follow a light diet the first few days after arrival home, such as soup and crackers or toast.  Be sure to include plenty of fluids daily.  Most patients will experience some swelling and bruising in the area of the incisions.  Ice packs will help.  Swelling and bruising can take several days to resolve.   It is common to experience some constipation after surgery.  Increasing fluid intake and taking a stool softener (such as Colace) will usually help or prevent this problem from occurring.  A mild laxative (Milk of Magnesia or Miralax) should be taken according to package instructions if there has been no bowel movement after 48 hours.  You will have steri-strips and a gauze dressing over your incisions.  You may remove the gauze bandage on the second day after surgery, and you may shower at that time.  Leave your steri-strips (small skin tapes) in place directly over the incision.  These strips should remain on the skin for 5-7 days and then be removed.  You may get them wet in the shower and pat them dry.  Any sutures or staples will be removed at the office during your follow-up visit.  ACTIVITIES:  You may resume regular (light) daily activities beginning the next day - such as daily self-care, walking, climbing stairs - gradually increasing activities as tolerated.  You may have sexual intercourse when it is comfortable.  Refrain from any heavy lifting or straining until approved by your doctor.  You may drive when you are no longer taking prescription pain medication, you can comfortably wear a seatbelt, and you can safely maneuver your car and apply brakes.  You should see your doctor in the office for a follow-up appointment approximately 2-3 weeks after your surgery.  Make sure that you call for this appointment within a day or two after you arrive home to insure a convenient appointment time.  WHEN TO CALL YOUR  DOCTOR: Fever over 101.0 Inability to urinate Continued bleeding from incision Increased pain, redness, or drainage from the incision Increasing abdominal pain  The clinic staff is available to answer your questions during regular business hours.  Please don't hesitate to call and ask to speak to one of the nurses for clinical concerns.  If you have a medical emergency, go to the nearest emergency room or call 911.  A surgeon from [facility] is always on call for the hospital.  DRAIN CARE AS INSTRUCTED.  RETURN TO [facility] OFFICE FOR DRAIN REMOVAL BY DR. Alphonsa Overall.  Earnstine Regal, MD, [facility] Office: [place] [facility]:  [place] [phone]  Website: [email or web address]     Increase activity slowly    Complete by:  As directed             Medication List    TAKE these medications        acetaminophen 500 MG tablet  Commonly known as:  TYLENOL  Take 1,000 mg by mouth every 4 (four) hours as needed for fever.     amLODipine 10 MG tablet  Commonly known as:  NORVASC  Take 1 tablet (10 mg total) by mouth daily.     aspirin 81 MG tablet  Take 81 mg by mouth daily.     bismuth subsalicylate 761 YW/73XT suspension  Commonly known as:  PEPTO BISMOL  Take 30 mLs by mouth every 6 (six) hours as needed for indigestion or diarrhea or loose stools.     carvedilol 6.25 MG tablet  Commonly known as:  COREG  Take 1 tablet (6.25 mg total) by mouth 2 (two) times daily with a meal.     diphenhydrAMINE 25 MG tablet  Commonly known as:  BENADRYL  Take 25-50 mg by mouth at bedtime as needed for sleep.     fenofibrate 160 MG tablet  TAKE 1 TABLET BY MOUTH ONCE DAILY AT BEDTIME     HYDROcodone-acetaminophen 5-325 MG per tablet  Commonly known as:  NORCO/VICODIN  Take 1-2 tablets by mouth every 4 (four) hours as needed for moderate pain.     ibuprofen 200 MG tablet  Commonly known as:  ADVIL,MOTRIN  Take 400 mg  by mouth 3 (three) times daily as needed for mild pain or moderate pain.     omeprazole 20 MG capsule  Commonly known as:  PRILOSEC  Take 20 mg by mouth daily as needed (for acid reflux).     ondansetron 4 mg Tabs tablet  Commonly known as:  ZOFRAN  Take 4 tablets by mouth every 8 (eight) hours as needed.     pravastatin 20 MG tablet  Commonly known as:  PRAVACHOL  Take 1 tablet (20 mg total) by mouth at bedtime.           Follow-up Information    Follow up with [facility], MD On 10/06/2014.   Specialty:  General Surgery   Why:  12pm, arrive by 11:45am for check-in, for drain removal   Contact  information:   [address] [place] [address] [phone]       Follow up with [facility], MD. Schedule an appointment as soon as possible for a visit in 3 days.   Specialty:  General Surgery   Why:  For wound re-check and drain removal   Contact information:   [address] [place] [id number] [phone]       Libbey Duce M. Joseangel Nettleton, MD, [facility] Office: [phone]   Signed: Earnstine Regal 10/02/2014, 9:13 AM

## 2014-12-13 ENCOUNTER — Ambulatory Visit (INDEPENDENT_AMBULATORY_CARE_PROVIDER_SITE_OTHER): Payer: 59 | Admitting: Family Medicine

## 2014-12-13 ENCOUNTER — Encounter: Payer: Self-pay | Admitting: Family Medicine

## 2014-12-13 VITALS — BP 134/86 | Ht 66.0 in | Wt 175.0 lb

## 2014-12-13 DIAGNOSIS — I10 Essential (primary) hypertension: Secondary | ICD-10-CM | POA: Diagnosis not present

## 2014-12-13 DIAGNOSIS — E785 Hyperlipidemia, unspecified: Secondary | ICD-10-CM

## 2014-12-13 DIAGNOSIS — E119 Type 2 diabetes mellitus without complications: Secondary | ICD-10-CM | POA: Diagnosis not present

## 2014-12-13 LAB — POCT GLYCOSYLATED HEMOGLOBIN (HGB A1C): HEMOGLOBIN A1C: 5.1

## 2014-12-13 MED ORDER — PRAVASTATIN SODIUM 20 MG PO TABS: 20.0000 mg | ORAL_TABLET | Freq: Every day | ORAL | Status: DC

## 2014-12-13 MED ORDER — AMLODIPINE BESYLATE 10 MG PO TABS: 10.0000 mg | ORAL_TABLET | Freq: Every day | ORAL | Status: DC

## 2014-12-13 MED ORDER — CARVEDILOL 6.25 MG PO TABS: 6.2500 mg | ORAL_TABLET | Freq: Two times a day (BID) | ORAL | Status: DC

## 2014-12-13 MED ORDER — FENOFIBRATE 160 MG PO TABS: ORAL_TABLET | ORAL | Status: DC

## 2014-12-13 NOTE — Progress Notes (Signed)
   Subjective:    Patient ID: Hunter Franklin, male    DOB: [birth year], 64 y.o.   MRN: [id number]  Diabetes He presents for his follow-up diabetic visit. He has type 2 diabetes mellitus. Current diabetic treatment includes diet. He is compliant with treatment all of the time. Exercise: walks a lot at work. His breakfast blood glucose range is generally 70-90 mg/dl. He does not see a podiatrist.Eye exam is current.    A1C today 5.1  100 and vbelow on the sugar  Results for orders placed or performed in visit on 12/13/14  POCT glycosylated hemoglobin (Hb A1C)  Result Value Ref Range   Hemoglobin A1C 5.1   HM DIABETES EYE EXAM  Result Value Ref Range   HM Diabetic Eye Exam No Retinopathy No Retinopathy    Compliant with bp meds    Pt states he needs bilirubin rechecked. Last one 09/27/14. Had elevated liver enzymes. Surgeon 1 these recheck to make sure they were okay.   Patient compliant blood pressure medicine. No obvious side effects. Watching salt intake. Exercise regularly.  Patient compliance with medicine. Trying to watch diet. No side effects with medicine. Has cut down fat in diet fairly effectively.  Review of Systems No headache no chest pain and back pain no abdominal pain no change in bowel habits    Objective:   Physical Exam  Alert vitals stable blood pressure good on repeat. H&T normal. Lungs clear. Heart regular in rhythm. Ankles edema      Assessment & Plan:  Impression 1 type 2 diabetes good control #2 hypertension good control #3 hyperlipidemia status uncertain #4 elevated liver enzymes will recheck plan check appropriate blood work. Diet exercise discussed. Recheck in 6 months WSL

## 2014-12-23 ENCOUNTER — Other Ambulatory Visit (INDEPENDENT_AMBULATORY_CARE_PROVIDER_SITE_OTHER): Payer: Self-pay | Admitting: *Deleted

## 2014-12-23 DIAGNOSIS — Z1211 Encounter for screening for malignant neoplasm of colon: Secondary | ICD-10-CM

## 2015-01-15 LAB — BASIC METABOLIC PANEL
BUN / CREAT RATIO: 12 (ref 10–22)
BUN: 22 mg/dL (ref 8–27)
CALCIUM: 9.6 mg/dL (ref 8.6–10.2)
CO2: 23 mmol/L (ref 18–29)
Chloride: 101 mmol/L (ref 97–108)
Creatinine, Ser: 1.91 mg/dL — ABNORMAL HIGH (ref 0.76–1.27)
GFR calc Af Amer: 42 mL/min/{1.73_m2} — ABNORMAL LOW (ref >59)
GFR, EST NON AFRICAN AMERICAN: 36 mL/min/{1.73_m2} — AB (ref >59)
Glucose: 115 mg/dL — ABNORMAL HIGH (ref 65–99)
Potassium: 4.4 mmol/L (ref 3.5–5.2)
SODIUM: 141 mmol/L (ref 134–144)

## 2015-01-15 LAB — HEPATIC FUNCTION PANEL
ALBUMIN: 4.5 g/dL (ref 3.6–4.8)
ALT: 35 IU/L (ref 0–44)
AST: 41 IU/L — AB (ref 0–40)
Alkaline Phosphatase: 51 IU/L (ref 39–117)
Bilirubin Total: 0.5 mg/dL (ref 0.0–1.2)
Bilirubin, Direct: 0.12 mg/dL (ref 0.00–0.40)
Total Protein: 7 g/dL (ref 6.0–8.5)

## 2015-01-15 LAB — LIPID PANEL
Chol/HDL Ratio: 5.3 ratio units — ABNORMAL HIGH (ref 0.0–5.0)
Cholesterol, Total: 190 mg/dL (ref 100–199)
HDL: 36 mg/dL — ABNORMAL LOW (ref >39)
LDL CALC: 131 mg/dL — AB (ref 0–99)
TRIGLYCERIDES: 116 mg/dL (ref 0–149)
VLDL CHOLESTEROL CAL: 23 mg/dL (ref 5–40)

## 2015-01-16 ENCOUNTER — Encounter: Payer: Self-pay | Admitting: Family Medicine

## 2015-01-25 ENCOUNTER — Telehealth (INDEPENDENT_AMBULATORY_CARE_PROVIDER_SITE_OTHER): Payer: Self-pay | Admitting: *Deleted

## 2015-01-25 DIAGNOSIS — Z1211 Encounter for screening for malignant neoplasm of colon: Secondary | ICD-10-CM

## 2015-01-25 MED ORDER — SUPREP BOWEL PREP KIT 17.5-3.13-1.6 GM/177ML PO SOLN: 1.0000 | Freq: Once | ORAL | Status: DC

## 2015-01-25 NOTE — Telephone Encounter (Signed)
Patient needs suprep 

## 2015-02-08 ENCOUNTER — Telehealth (INDEPENDENT_AMBULATORY_CARE_PROVIDER_SITE_OTHER): Payer: Self-pay | Admitting: *Deleted

## 2015-02-08 NOTE — Telephone Encounter (Signed)
Referring MD/PCP: steve luking   Procedure: tcs  Reason/Indication:  screening  Has patient had this procedure before?  no  If so, when, by whom and where?    Is there a family history of colon cancer?  no  Who?  What age when diagnosed?    Is patient diabetic?   Yes, diet control      Does patient have prosthetic heart valve?  no  Do you have a pacemaker?  no  Has patient ever had endocarditis? no  Has patient had joint replacement within last 12 months?  no  Does patient tend to be constipated or take laxatives? no  Does patient have a history of alcohol/drug use? no  Is patient on Coumadin, Plavix and/or Aspirin? yes  Medications: see epic  Allergies: nkda  Medication Adjustment: asa 2 days  Procedure date & time: 03/03/15 at 730

## 2015-02-08 NOTE — Telephone Encounter (Signed)
agree

## 2015-03-03 ENCOUNTER — Encounter (HOSPITAL_COMMUNITY): Payer: Self-pay | Admitting: *Deleted

## 2015-03-03 ENCOUNTER — Ambulatory Visit (HOSPITAL_COMMUNITY)
Admission: RE | Admit: 2015-03-03 | Discharge: 2015-03-03 | Disposition: A | Discharge status: Home / Self Care | Payer: 59 | Source: Ambulatory Visit | Attending: Internal Medicine | Admitting: Internal Medicine

## 2015-03-03 ENCOUNTER — Encounter (HOSPITAL_COMMUNITY)
Admission: RE | Disposition: A | Discharge status: Home / Self Care | Payer: Self-pay | Source: Ambulatory Visit | Attending: Internal Medicine

## 2015-03-03 DIAGNOSIS — Z87891 Personal history of nicotine dependence: Secondary | ICD-10-CM | POA: Diagnosis not present

## 2015-03-03 DIAGNOSIS — Q438 Other specified congenital malformations of intestine: Secondary | ICD-10-CM | POA: Insufficient documentation

## 2015-03-03 DIAGNOSIS — E7800 Pure hypercholesterolemia, unspecified: Secondary | ICD-10-CM | POA: Diagnosis not present

## 2015-03-03 DIAGNOSIS — Z1211 Encounter for screening for malignant neoplasm of colon: Secondary | ICD-10-CM | POA: Insufficient documentation

## 2015-03-03 DIAGNOSIS — D123 Benign neoplasm of transverse colon: Secondary | ICD-10-CM | POA: Diagnosis not present

## 2015-03-03 DIAGNOSIS — Z8249 Family history of ischemic heart disease and other diseases of the circulatory system: Secondary | ICD-10-CM | POA: Insufficient documentation

## 2015-03-03 DIAGNOSIS — Z7982 Long term (current) use of aspirin: Secondary | ICD-10-CM | POA: Diagnosis not present

## 2015-03-03 DIAGNOSIS — Z79899 Other long term (current) drug therapy: Secondary | ICD-10-CM | POA: Diagnosis not present

## 2015-03-03 DIAGNOSIS — I10 Essential (primary) hypertension: Secondary | ICD-10-CM | POA: Insufficient documentation

## 2015-03-03 DIAGNOSIS — K219 Gastro-esophageal reflux disease without esophagitis: Secondary | ICD-10-CM | POA: Diagnosis not present

## 2015-03-03 DIAGNOSIS — N2889 Other specified disorders of kidney and ureter: Secondary | ICD-10-CM | POA: Diagnosis not present

## 2015-03-03 HISTORY — PX: COLONOSCOPY: SHX5424

## 2015-03-03 SURGERY — COLONOSCOPY
Anesthesia: Moderate Sedation

## 2015-03-03 MED ORDER — MEPERIDINE HCL 50 MG/ML IJ SOLN
INTRAMUSCULAR | Status: AC
  Filled 2015-03-03: qty 1

## 2015-03-03 MED ORDER — MIDAZOLAM HCL 5 MG/5ML IJ SOLN
INTRAMUSCULAR | Status: DC | PRN
  Administered 2015-03-03: 1 mg via INTRAVENOUS
  Administered 2015-03-03: 2 mg via INTRAVENOUS
  Administered 2015-03-03: 1 mg via INTRAVENOUS
  Administered 2015-03-03: 2 mg via INTRAVENOUS

## 2015-03-03 MED ORDER — SODIUM CHLORIDE 0.9 % IV SOLN
INTRAVENOUS | Status: DC
  Administered 2015-03-03: 08:00:00 via INTRAVENOUS

## 2015-03-03 MED ORDER — MEPERIDINE HCL 50 MG/ML IJ SOLN
INTRAMUSCULAR | Status: DC | PRN
  Administered 2015-03-03 (×2): 25 mg via INTRAVENOUS

## 2015-03-03 MED ORDER — STERILE WATER FOR IRRIGATION IR SOLN
Status: DC | PRN
  Administered 2015-03-03: 09:00:00

## 2015-03-03 MED ORDER — MIDAZOLAM HCL 5 MG/5ML IJ SOLN
INTRAMUSCULAR | Status: AC
  Filled 2015-03-03: qty 10

## 2015-03-03 NOTE — Discharge Instructions (Signed)
Resume usual medications and diet. °No driving for 24 hours. °Physician will call with biopsy results.Colonoscopy, Care After °Refer to this sheet in the next few weeks. These instructions provide you with information on caring for yourself after your procedure. Your health care provider may also give you more specific instructions. Your treatment has been planned according to current medical practices, but problems sometimes occur. Call your health care provider if you have any problems or questions after your procedure. °WHAT TO EXPECT AFTER THE PROCEDURE  °After your procedure, it is typical to have the following: °· A small amount of blood in your stool. °· Moderate amounts of gas and mild abdominal cramping or bloating. °HOME CARE INSTRUCTIONS °· Do not drive, operate machinery, or sign important documents for 24 hours. °· You may shower and resume your regular physical activities, but move at a slower pace for the first 24 hours. °· Take frequent rest periods for the first 24 hours. °· Walk around or put a warm pack on your abdomen to help reduce abdominal cramping and bloating. °· Drink enough fluids to keep your urine clear or pale yellow. °· You may resume your normal diet as instructed by your health care provider. Avoid heavy or fried foods that are hard to digest. °· Avoid drinking alcohol for 24 hours or as instructed by your health care provider. °· Only take over-the-counter or prescription medicines as directed by your health care provider. °· If a tissue sample (biopsy) was taken during your procedure: °¨ Do not take aspirin or blood thinners for 7 days, or as instructed by your health care provider. °¨ Do not drink alcohol for 7 days, or as instructed by your health care provider. °¨ Eat soft foods for the first 24 hours. °SEEK MEDICAL CARE IF: °You have persistent spotting of blood in your stool 2-3 days after the procedure. °SEEK IMMEDIATE MEDICAL CARE IF: °· You have more than a small spotting  of blood in your stool. °· You pass large blood clots in your stool. °· Your abdomen is swollen (distended). °· You have nausea or vomiting. °· You have a fever. °· You have increasing abdominal pain that is not relieved with medicine. °  °This information is not intended to replace advice given to you by your health care provider. Make sure you discuss any questions you have with your health care provider. °  °Document Released: 11/15/2003 Document Revised: 01/21/2013 Document Reviewed: 12/08/2012 °Elsevier Interactive Patient Education ©2016 [employer]. ° °

## 2015-03-03 NOTE — H&P (Signed)
Hunter Franklin is an 64 y.o. male.   Chief Complaint:  Patient is here for colonoscopy. HPI:  Patient is 64 year old Caucasian male who is here for screening colonoscopy. This is patient's first exam. He denies abdominal pain change in bowel habits or rectal bleeding.  Family history is negative for CRC.  Past Medical History  Diagnosis Date  . Hypertension   . Hypercholesteremia   . PUD (peptic ulcer disease)   . Gout   . Reflux   . Impaired glucose metabolism   . Renal disorder     1 kidney that is working at 45 %.    Past Surgical History  Procedure Laterality Date  . Abdominal surgery    . Cholecystectomy N/A 09/30/2014    Procedure: LAPAROSCOPIC CHOLECYSTECTOMY WITH INTRAOPERATIVE CHOLANGIOGRAM;  Surgeon: Alphonsa Overall, MD;  Location: [facility];  Service: General;  Laterality: N/A;    Family History  Problem Relation Age of Onset  . Diabetes Mother   . Heart attack Mother   . Hypertension Sister    Social History:  reports that he quit smoking about 18 years ago. He has never used smokeless tobacco. He reports that he does not drink alcohol or use illicit drugs.  Allergies: No Known Allergies  Medications Prior to Admission  Medication Sig Dispense Refill  . amLODipine (NORVASC) 10 MG tablet Take 1 tablet (10 mg total) by mouth daily. 90 tablet 1  . aspirin 81 MG tablet Take 81 mg by mouth daily.    . carvedilol (COREG) 6.25 MG tablet Take 1 tablet (6.25 mg total) by mouth 2 (two) times daily with a meal. 180 tablet 1  . diphenhydrAMINE (BENADRYL) 25 MG tablet Take 25-50 mg by mouth at bedtime as needed for sleep.     . fenofibrate 160 MG tablet TAKE 1 TABLET BY MOUTH ONCE DAILY AT BEDTIME 90 tablet 1  . omeprazole (PRILOSEC) 20 MG capsule Take 20 mg by mouth daily as needed (for acid reflux).    . pravastatin (PRAVACHOL) 20 MG tablet Take 1 tablet (20 mg total) by mouth at bedtime. 90 tablet 1  . SUPREP BOWEL PREP SOLN Take 1 kit by mouth once. 1 Bottle 0  . bismuth  subsalicylate (PEPTO BISMOL) 262 MG/15ML suspension Take 30 mLs by mouth every 6 (six) hours as needed for indigestion or diarrhea or loose stools.      No results found for this or any previous visit (from the past 48 hour(s)). No results found.  ROS  Blood pressure 136/83, pulse 69, temperature 98.2 F (36.8 C), temperature source Oral, resp. rate 12, height 5' 7"  (1.702 m), weight 164 lb (74.39 kg), SpO2 98 %. Physical Exam  Constitutional: He appears well-developed and well-nourished.  HENT:  Mouth/Throat: Oropharynx is clear and moist.  Eyes: Conjunctivae are normal. No scleral icterus.  Neck: No thyromegaly present.  Cardiovascular: Normal rate, regular rhythm and normal heart sounds.   No murmur heard. Respiratory: Effort normal and breath sounds normal.  GI: Soft. He exhibits no distension and no mass. There is no tenderness.  Musculoskeletal: He exhibits no edema.  Lymphadenopathy:    He has no cervical adenopathy.  Neurological: He is alert.  Skin: Skin is warm and dry.     Assessment/Plan  Average risk screening colonoscopy.  Hunter Franklin 03/03/2015, 8:27 AM

## 2015-03-03 NOTE — Op Note (Signed)
COLONOSCOPY PROCEDURE REPORT  PATIENT:  Hunter Franklin  MR#:  [id number] Birthdate:  1950/08/06, 64 y.o., male Endoscopist:  Dr. Rogene Houston, MD Referred By:  Dr. Grace Bushy. Wolfgang Phoenix, MD  Procedure Date: 03/03/2015  Procedure:   Colonoscopy  Indications:  Patient is 64 year old Caucasian male who is undergoing average risk screening colonoscopy. This is patient's first exam.  Informed Consent:  The procedure and risks were reviewed with the patient and informed consent was obtained.  Medications:  Demerol 50 mg IV Versed 6 mg IV  Description of procedure:  After a digital rectal exam was performed, that colonoscope was advanced from the anus through the rectum and colon to the area of the cecum, ileocecal valve and appendiceal orifice. The cecum was deeply intubated. These structures were well-seen and photographed for the record. From the level of the cecum and ileocecal valve, the scope was slowly and cautiously withdrawn. The mucosal surfaces were carefully surveyed utilizing scope tip to flexion to facilitate fold flattening as needed. The scope was pulled down into the rectum where a thorough exam including retroflexion was performed.  Pediatric colonoscope was exchanged with Slim scope in order to complete the procedure.  Findings:   Prep excellent. Redundant colon. Small polyp ablated via cold biopsy from splenic flexure. Normal rectal mucosa and anorectal junction.   Therapeutic/Diagnostic Maneuvers Performed:  See above  Complications:   none  EBL: None  Cecal Withdrawal Time:   7 minutes  Impression:   Examination performed to cecum.  Redundant colon.  Small polyp ablated via cold biopsy from splenic flexure.  Recommendations:  Standard instructions given. I will contact patient with biopsy results and further recommendations.  REHMAN,NAJEEB U  03/03/2015 9:24 AM  CC: Dr. Mickie Hillier, MD & Dr. Rayne Du ref. provider found

## 2015-03-07 ENCOUNTER — Encounter (HOSPITAL_COMMUNITY): Payer: Self-pay | Admitting: Internal Medicine

## 2015-04-28 MED FILL — TRUE METRIX GLUCOSE TEST ST: 90 days supply | Qty: 100 | Fill #1

## 2015-04-28 MED FILL — TRUEplus LANCETS 30G MISC: 90 days supply | Qty: 100 | Fill #1

## 2015-05-30 MED FILL — CARVEDILOL 6.25 MG TABLET: 6.25 | 90 days supply | Qty: 180 | Fill #1

## 2015-06-13 ENCOUNTER — Encounter: Payer: Self-pay | Admitting: Family Medicine

## 2015-06-13 ENCOUNTER — Ambulatory Visit (INDEPENDENT_AMBULATORY_CARE_PROVIDER_SITE_OTHER): Payer: 59 | Admitting: Family Medicine

## 2015-06-13 VITALS — BP 132/76 | Ht 66.0 in | Wt 183.2 lb

## 2015-06-13 DIAGNOSIS — Z125 Encounter for screening for malignant neoplasm of prostate: Secondary | ICD-10-CM | POA: Diagnosis not present

## 2015-06-13 DIAGNOSIS — I10 Essential (primary) hypertension: Secondary | ICD-10-CM | POA: Diagnosis not present

## 2015-06-13 DIAGNOSIS — N183 Chronic kidney disease, stage 3 unspecified: Secondary | ICD-10-CM

## 2015-06-13 DIAGNOSIS — D631 Anemia in chronic kidney disease: Secondary | ICD-10-CM | POA: Diagnosis not present

## 2015-06-13 DIAGNOSIS — E785 Hyperlipidemia, unspecified: Secondary | ICD-10-CM

## 2015-06-13 DIAGNOSIS — E119 Type 2 diabetes mellitus without complications: Secondary | ICD-10-CM

## 2015-06-13 LAB — POCT GLYCOSYLATED HEMOGLOBIN (HGB A1C): Hemoglobin A1C: 5.4

## 2015-06-13 MED ORDER — PRAVASTATIN SODIUM 20 MG PO TABS: 20.0000 mg | ORAL_TABLET | Freq: Every day | ORAL | Status: DC

## 2015-06-13 MED ORDER — AMLODIPINE BESYLATE 10 MG PO TABS: 10.0000 mg | ORAL_TABLET | Freq: Every day | ORAL | Status: DC

## 2015-06-13 MED ORDER — FENOFIBRATE 160 MG PO TABS: ORAL_TABLET | ORAL | Status: DC

## 2015-06-13 MED ORDER — CARVEDILOL 6.25 MG PO TABS: 6.2500 mg | ORAL_TABLET | Freq: Two times a day (BID) | ORAL | Status: DC

## 2015-06-13 MED FILL — AMLODIPINE BESYLATE 10 MG T: 10 | 90 days supply | Qty: 90 | Fill #0

## 2015-06-13 MED FILL — FENOFIBRATE 160 MG TABLET: 160 | 90 days supply | Qty: 90 | Fill #0

## 2015-06-13 NOTE — Progress Notes (Signed)
   Subjective:    Patient ID: Hunter Franklin, male    DOB: July 12, 1950, 65 y.o.   MRN: [id number] Patient arrives office with several distinct concerns.   Diabetes He presents for his follow-up diabetic visit. He has type 2 diabetes mellitus. No MedicAlert identification noted. He has not had a previous visit with a dietitian. He does not see a podiatrist.Eye exam is current.   Patient states no other concerns this visit.  Results for orders placed or performed in visit on 06/13/15  POCT glycosylated hemoglobin (Hb A1C)  Result Value Ref Range   Hemoglobin A1C 5.4    Glucoses s a little high mostly watching diet and exercising very regularly   watchig diet closely  BP 130 or so as far as numbers go,takes meds faithfully . No obvious side effects with medications. Watching salt intake.  Compliant with her lipid medicine. Prior blood work reviewed. Does not miss a dose. No obvious side effects.  States reflux is stable as long as he takes his medication. Without it he has a flare of heartburn and reflux and states she definitely needs it    Review of Systems No headache no chest pain and back pain abdominal pain no change in bowel habits no blood in stool ROS otherwise negative    Objective:   Physical Exam  Alert vitals stable. HEENT normal. Lungs clear. Heart regular in rhythm. Ankles without edema. Blood pressure excellent on repeat.  C diabetic foot exam      Assessment & Plan:  Impression 1 type 2 diabetes great control numbers discussed maintain same #2 hypertension good control #discussed maintain same #3 hyperlipidemia status uncertain time check blood work #4 renal insufficiency coronary were filtration rate 44 6 months ago definitely time to reassess discussed plan appropriate blood work. Medications refilled. Diet exercise discussed. Recheck in 6 months WSL

## 2015-06-24 DIAGNOSIS — E119 Type 2 diabetes mellitus without complications: Secondary | ICD-10-CM | POA: Diagnosis not present

## 2015-06-24 DIAGNOSIS — Z125 Encounter for screening for malignant neoplasm of prostate: Secondary | ICD-10-CM | POA: Diagnosis not present

## 2015-06-24 DIAGNOSIS — E785 Hyperlipidemia, unspecified: Secondary | ICD-10-CM | POA: Diagnosis not present

## 2015-06-24 DIAGNOSIS — I10 Essential (primary) hypertension: Secondary | ICD-10-CM | POA: Diagnosis not present

## 2015-06-25 LAB — MICROALBUMIN / CREATININE URINE RATIO
CREATININE, UR: 83.9 mg/dL
MICROALB/CREAT RATIO: 46.2 mg/g{creat} — AB (ref 0.0–30.0)
Microalbumin, Urine: 38.8 ug/mL

## 2015-06-25 LAB — LIPID PANEL
CHOL/HDL RATIO: 5 ratio (ref 0.0–5.0)
Cholesterol, Total: 185 mg/dL (ref 100–199)
HDL: 37 mg/dL — AB (ref >39)
LDL CALC: 124 mg/dL — AB (ref 0–99)
Triglycerides: 119 mg/dL (ref 0–149)
VLDL CHOLESTEROL CAL: 24 mg/dL (ref 5–40)

## 2015-06-25 LAB — BASIC METABOLIC PANEL
BUN/Creatinine Ratio: 14 (ref 10–22)
BUN: 24 mg/dL (ref 8–27)
CHLORIDE: 104 mmol/L (ref 96–106)
CO2: 21 mmol/L (ref 18–29)
CREATININE: 1.72 mg/dL — AB (ref 0.76–1.27)
Calcium: 9.3 mg/dL (ref 8.6–10.2)
GFR calc Af Amer: 48 mL/min/{1.73_m2} — ABNORMAL LOW (ref >59)
GFR calc non Af Amer: 41 mL/min/{1.73_m2} — ABNORMAL LOW (ref >59)
GLUCOSE: 122 mg/dL — AB (ref 65–99)
Potassium: 4.5 mmol/L (ref 3.5–5.2)
Sodium: 143 mmol/L (ref 134–144)

## 2015-06-25 LAB — HEPATIC FUNCTION PANEL
ALT: 23 IU/L (ref 0–44)
AST: 34 IU/L (ref 0–40)
Albumin: 4.5 g/dL (ref 3.6–4.8)
Alkaline Phosphatase: 60 IU/L (ref 39–117)
Bilirubin Total: 0.4 mg/dL (ref 0.0–1.2)
Bilirubin, Direct: 0.13 mg/dL (ref 0.00–0.40)
TOTAL PROTEIN: 7.1 g/dL (ref 6.0–8.5)

## 2015-06-25 LAB — PSA: PROSTATE SPECIFIC AG, SERUM: 1.2 ng/mL (ref 0.0–4.0)

## 2015-07-03 ENCOUNTER — Encounter: Payer: Self-pay | Admitting: Family Medicine

## 2015-07-04 MED FILL — PRAVASTATIN SODIUM 20 MG TA: 20 | 90 days supply | Qty: 90 | Fill #0

## 2015-07-28 MED FILL — TRUE METRIX GLUCOSE TEST ST: 90 days supply | Qty: 100 | Fill #2

## 2015-07-28 MED FILL — TRUEplus LANCETS 30G MISC: 90 days supply | Qty: 100 | Fill #2

## 2015-09-05 ENCOUNTER — Other Ambulatory Visit: Payer: Self-pay | Admitting: Family Medicine

## 2015-09-05 MED FILL — CARVEDILOL 6.25 MG TABLET: 6.25 | 90 days supply | Qty: 180 | Fill #0

## 2015-09-14 MED FILL — FENOFIBRATE 160 MG TABLET: 160 | 90 days supply | Qty: 90 | Fill #1

## 2015-09-21 MED FILL — AMLODIPINE BESYLATE 10 MG T: 10 | 90 days supply | Qty: 90 | Fill #1

## 2015-09-29 MED FILL — PRAVASTATIN SODIUM 20 MG TA: 20 | 90 days supply | Qty: 90 | Fill #1

## 2015-11-14 DIAGNOSIS — N189 Chronic kidney disease, unspecified: Secondary | ICD-10-CM | POA: Diagnosis not present

## 2015-11-14 DIAGNOSIS — N2581 Secondary hyperparathyroidism of renal origin: Secondary | ICD-10-CM | POA: Diagnosis not present

## 2015-11-14 DIAGNOSIS — N183 Chronic kidney disease, stage 3 (moderate): Secondary | ICD-10-CM | POA: Diagnosis not present

## 2015-11-21 DIAGNOSIS — Z6830 Body mass index (BMI) 30.0-30.9, adult: Secondary | ICD-10-CM | POA: Diagnosis not present

## 2015-11-21 DIAGNOSIS — N183 Chronic kidney disease, stage 3 (moderate): Secondary | ICD-10-CM | POA: Diagnosis not present

## 2015-11-21 DIAGNOSIS — N2581 Secondary hyperparathyroidism of renal origin: Secondary | ICD-10-CM | POA: Diagnosis not present

## 2015-11-21 DIAGNOSIS — I129 Hypertensive chronic kidney disease with stage 1 through stage 4 chronic kidney disease, or unspecified chronic kidney disease: Secondary | ICD-10-CM | POA: Diagnosis not present

## 2015-11-21 DIAGNOSIS — D631 Anemia in chronic kidney disease: Secondary | ICD-10-CM | POA: Diagnosis not present

## 2015-12-13 ENCOUNTER — Encounter: Payer: Self-pay | Admitting: Family Medicine

## 2015-12-13 ENCOUNTER — Ambulatory Visit (INDEPENDENT_AMBULATORY_CARE_PROVIDER_SITE_OTHER): Payer: PPO | Admitting: Family Medicine

## 2015-12-13 VITALS — BP 136/80 | Ht 67.0 in | Wt 180.0 lb

## 2015-12-13 DIAGNOSIS — E119 Type 2 diabetes mellitus without complications: Secondary | ICD-10-CM

## 2015-12-13 DIAGNOSIS — I10 Essential (primary) hypertension: Secondary | ICD-10-CM | POA: Diagnosis not present

## 2015-12-13 DIAGNOSIS — H9311 Tinnitus, right ear: Secondary | ICD-10-CM

## 2015-12-13 DIAGNOSIS — H65111 Acute and subacute allergic otitis media (mucoid) (sanguinous) (serous), right ear: Secondary | ICD-10-CM | POA: Diagnosis not present

## 2015-12-13 DIAGNOSIS — Z79899 Other long term (current) drug therapy: Secondary | ICD-10-CM

## 2015-12-13 DIAGNOSIS — E785 Hyperlipidemia, unspecified: Secondary | ICD-10-CM | POA: Diagnosis not present

## 2015-12-13 LAB — POCT GLYCOSYLATED HEMOGLOBIN (HGB A1C): HEMOGLOBIN A1C: 5.6

## 2015-12-13 MED ORDER — PRAVASTATIN SODIUM 20 MG PO TABS: 20.0000 mg | ORAL_TABLET | Freq: Every day | ORAL | 1 refills | Status: DC

## 2015-12-13 MED ORDER — CARVEDILOL 6.25 MG PO TABS: ORAL_TABLET | ORAL | 1 refills | Status: DC

## 2015-12-13 MED ORDER — FENOFIBRATE 160 MG PO TABS: ORAL_TABLET | ORAL | 1 refills | Status: DC

## 2015-12-13 MED ORDER — AMOXICILLIN-POT CLAVULANATE 875-125 MG PO TABS: 1.0000 | ORAL_TABLET | Freq: Two times a day (BID) | ORAL | 0 refills | Status: DC

## 2015-12-13 MED ORDER — AMLODIPINE BESYLATE 10 MG PO TABS: 10.0000 mg | ORAL_TABLET | Freq: Every day | ORAL | 1 refills | Status: DC

## 2015-12-13 MED FILL — AMLODIPINE BESYLATE 10 MG T: 10 | 90 days supply | Qty: 90 | Fill #0

## 2015-12-13 MED FILL — CARVEDILOL 6.25 MG TABLET: 6.25 | 90 days supply | Qty: 180 | Fill #0

## 2015-12-13 MED FILL — FENOFIBRATE 160 MG TABLET: 160 | 90 days supply | Qty: 90 | Fill #0

## 2015-12-13 MED FILL — AMOX-CLAV 875-125 MG TABLET: 875-125 | 10 days supply | Qty: 20 | Fill #0

## 2015-12-13 MED FILL — TRUEplus LANCETS 30G MISC: 90 days supply | Qty: 100 | Fill #3

## 2015-12-13 MED FILL — TRUE METRIX GLUCOSE TEST ST: 90 days supply | Qty: 100 | Fill #3

## 2015-12-13 MED FILL — PRAVASTATIN SODIUM 20 MG TA: 20 | 90 days supply | Qty: 90 | Fill #0

## 2015-12-13 NOTE — Progress Notes (Signed)
   Subjective:    Patient ID: Hunter MANCILLA Sr., male    DOB: May 09, 1950, 65 y.o.   MRN: [id number]  Diabetes  He presents for his follow-up diabetic visit. He has type 2 diabetes mellitus. He is compliant with treatment all of the time. He is following a diabetic diet. He participates in exercise daily. Home blood sugar record trend: has not check blood sugar in a couple of weeks. He does not see a podiatrist.Eye exam is current.  A1C today. 5.6  Out of strips   Out of plantcets   exrercise keepoing active   Blood pressure medicine and blood pressure levels reviewed today with patient. Compliant with blood pressure medicine. States does not miss a dose. No obvious side effects. Blood pressure generally good when checked elsewhere. Watching salt intake.   Patient continues to take lipid medication regularly. No obvious side effects from it. Generally does not miss a dose. Prior blood work results are reviewed with patient. Patient continues to work on fat intake in diet   Ringing in right ear for a couple of months.       Right ear in the right ear       Review of Systems No headache, no major weight loss or weight gain, no chest pain no back pain abdominal pain no change in bowel habits complete ROS otherwise negative     Objective:   Physical Exam  Alert vitals stable, NAD. Blood pressure good on repeat. HEENTRight otitis media otherwise normal. Lungs clear. Heart regular rate and rhythm.  Ankles no significant edema     Assessment & Plan:  Impression 1 type 2 diabetes good control A1c excellent #2 hypertension good control discussed maintain same #3 hyperlipidemia prior blood work reviewed to maintain same check blood work #4 right otitis media with recent tinnitus antibiotics prescribed and ENT referral. Follow-up in 6 months for wellness plus chronic

## 2015-12-15 ENCOUNTER — Other Ambulatory Visit: Payer: Self-pay | Admitting: Family Medicine

## 2015-12-16 DIAGNOSIS — I10 Essential (primary) hypertension: Secondary | ICD-10-CM | POA: Diagnosis not present

## 2015-12-16 DIAGNOSIS — E785 Hyperlipidemia, unspecified: Secondary | ICD-10-CM | POA: Diagnosis not present

## 2015-12-16 DIAGNOSIS — Z79899 Other long term (current) drug therapy: Secondary | ICD-10-CM | POA: Diagnosis not present

## 2015-12-17 LAB — BASIC METABOLIC PANEL
BUN / CREAT RATIO: 16 (ref 10–24)
BUN: 31 mg/dL — AB (ref 8–27)
CO2: 22 mmol/L (ref 18–29)
CREATININE: 1.91 mg/dL — AB (ref 0.76–1.27)
Calcium: 9.8 mg/dL (ref 8.6–10.2)
Chloride: 101 mmol/L (ref 96–106)
GFR calc Af Amer: 42 mL/min/{1.73_m2} — ABNORMAL LOW (ref >59)
GFR, EST NON AFRICAN AMERICAN: 36 mL/min/{1.73_m2} — AB (ref >59)
GLUCOSE: 134 mg/dL — AB (ref 65–99)
Potassium: 5.1 mmol/L (ref 3.5–5.2)
SODIUM: 141 mmol/L (ref 134–144)

## 2015-12-17 LAB — HEPATIC FUNCTION PANEL
ALBUMIN: 4.7 g/dL (ref 3.6–4.8)
ALT: 23 IU/L (ref 0–44)
AST: 29 IU/L (ref 0–40)
Alkaline Phosphatase: 52 IU/L (ref 39–117)
BILIRUBIN, DIRECT: 0.13 mg/dL (ref 0.00–0.40)
Bilirubin Total: 0.5 mg/dL (ref 0.0–1.2)
TOTAL PROTEIN: 7.1 g/dL (ref 6.0–8.5)

## 2015-12-17 LAB — LIPID PANEL
CHOL/HDL RATIO: 5.9 ratio — AB (ref 0.0–5.0)
Cholesterol, Total: 211 mg/dL — ABNORMAL HIGH (ref 100–199)
HDL: 36 mg/dL — AB (ref >39)
LDL Calculated: 146 mg/dL — ABNORMAL HIGH (ref 0–99)
Triglycerides: 143 mg/dL (ref 0–149)
VLDL Cholesterol Cal: 29 mg/dL (ref 5–40)

## 2015-12-21 ENCOUNTER — Encounter: Payer: Self-pay | Admitting: Family Medicine

## 2015-12-21 ENCOUNTER — Other Ambulatory Visit: Payer: Self-pay | Admitting: *Deleted

## 2015-12-21 MED ORDER — PRAVASTATIN SODIUM 40 MG PO TABS: 40.0000 mg | ORAL_TABLET | Freq: Every day | ORAL | 1 refills | Status: DC

## 2016-02-09 ENCOUNTER — Telehealth: Payer: Self-pay | Admitting: Family Medicine

## 2016-02-09 MED ORDER — PRAVASTATIN SODIUM 40 MG PO TABS: 40.0000 mg | ORAL_TABLET | Freq: Every day | ORAL | 1 refills | Status: DC

## 2016-02-09 MED FILL — PRAVASTATIN NA 40 MG TAB: 40 | 90 days supply | Qty: 90 | Fill #0 | Status: TO

## 2016-02-09 NOTE — Telephone Encounter (Signed)
Prescription sent electronically to pharmacy. Patient notified. 

## 2016-02-09 NOTE — Telephone Encounter (Signed)
Pt needs pravastatin (PRAVACHOL) 40 MG tablet refilled, please send to [place] gets a 90 day Rx and will need refills  States that he finished taking the 20mg 's that he had and pharmacy states too early for refill, please notify them of dose change     Please call pt when done

## 2016-02-24 ENCOUNTER — Encounter: Payer: Self-pay | Admitting: Family Medicine

## 2016-02-27 MED FILL — FENOFIBRATE 160 MG TABLET: 160 | 90 days supply | Qty: 90 | Fill #1

## 2016-03-16 MED FILL — CARVEDILOL 6.25 MG TABLET: 6.25 | 90 days supply | Qty: 180 | Fill #1

## 2016-03-19 DIAGNOSIS — H5213 Myopia, bilateral: Secondary | ICD-10-CM | POA: Diagnosis not present

## 2016-03-26 MED FILL — AMLODIPINE BESYLATE 10 MG T: 10 | 90 days supply | Qty: 90 | Fill #1

## 2016-03-29 MED FILL — TRUEplus LANCETS 30G MISC: 90 days supply | Qty: 100 | Fill #0 | Status: TO

## 2016-03-29 MED FILL — TRUE METRIX GLUCOSE TEST ST: 90 days supply | Qty: 100 | Fill #0 | Status: TO

## 2016-05-31 ENCOUNTER — Other Ambulatory Visit: Payer: Self-pay | Admitting: *Deleted

## 2016-05-31 MED ORDER — FENOFIBRATE 160 MG PO TABS: 160.0000 mg | ORAL_TABLET | Freq: Every day | ORAL | 0 refills | Status: DC

## 2016-06-13 ENCOUNTER — Encounter: Payer: Self-pay | Admitting: Family Medicine

## 2016-06-13 ENCOUNTER — Ambulatory Visit (INDEPENDENT_AMBULATORY_CARE_PROVIDER_SITE_OTHER): Payer: PPO | Admitting: Family Medicine

## 2016-06-13 VITALS — BP 132/82 | Ht 67.0 in | Wt 182.8 lb

## 2016-06-13 DIAGNOSIS — E785 Hyperlipidemia, unspecified: Secondary | ICD-10-CM

## 2016-06-13 DIAGNOSIS — I10 Essential (primary) hypertension: Secondary | ICD-10-CM | POA: Diagnosis not present

## 2016-06-13 DIAGNOSIS — Z0000 Encounter for general adult medical examination without abnormal findings: Secondary | ICD-10-CM

## 2016-06-13 DIAGNOSIS — E119 Type 2 diabetes mellitus without complications: Secondary | ICD-10-CM | POA: Diagnosis not present

## 2016-06-13 DIAGNOSIS — Z125 Encounter for screening for malignant neoplasm of prostate: Secondary | ICD-10-CM

## 2016-06-13 MED ORDER — PRAVASTATIN SODIUM 40 MG PO TABS: 40.0000 mg | ORAL_TABLET | Freq: Every day | ORAL | 1 refills | Status: DC

## 2016-06-13 MED ORDER — AMLODIPINE BESYLATE 10 MG PO TABS: 10.0000 mg | ORAL_TABLET | Freq: Every day | ORAL | 1 refills | Status: DC

## 2016-06-13 MED ORDER — FENOFIBRATE 160 MG PO TABS: 160.0000 mg | ORAL_TABLET | Freq: Every day | ORAL | 5 refills | Status: DC

## 2016-06-13 MED ORDER — CARVEDILOL 6.25 MG PO TABS: ORAL_TABLET | ORAL | 1 refills | Status: DC
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
## 2016-06-22 DIAGNOSIS — E119 Type 2 diabetes mellitus without complications: Secondary | ICD-10-CM | POA: Diagnosis not present

## 2016-06-22 DIAGNOSIS — I10 Essential (primary) hypertension: Secondary | ICD-10-CM | POA: Diagnosis not present

## 2016-06-22 DIAGNOSIS — Z125 Encounter for screening for malignant neoplasm of prostate: Secondary | ICD-10-CM | POA: Diagnosis not present

## 2016-06-22 DIAGNOSIS — E785 Hyperlipidemia, unspecified: Secondary | ICD-10-CM | POA: Diagnosis not present

## 2016-06-23 LAB — BASIC METABOLIC PANEL
BUN / CREAT RATIO: 14 (ref 10–24)
BUN: 24 mg/dL (ref 8–27)
CO2: 24 mmol/L (ref 18–29)
CREATININE: 1.73 mg/dL — AB (ref 0.76–1.27)
Calcium: 9.5 mg/dL (ref 8.6–10.2)
Chloride: 102 mmol/L (ref 96–106)
GFR calc Af Amer: 47 mL/min/{1.73_m2} — ABNORMAL LOW (ref >59)
GFR calc non Af Amer: 41 mL/min/{1.73_m2} — ABNORMAL LOW (ref >59)
GLUCOSE: 125 mg/dL — AB (ref 65–99)
Potassium: 4.5 mmol/L (ref 3.5–5.2)
SODIUM: 143 mmol/L (ref 134–144)

## 2016-06-23 LAB — HEPATIC FUNCTION PANEL
ALK PHOS: 57 IU/L (ref 39–117)
ALT: 20 IU/L (ref 0–44)
AST: 27 IU/L (ref 0–40)
Albumin: 4.6 g/dL (ref 3.6–4.8)
BILIRUBIN, DIRECT: 0.15 mg/dL (ref 0.00–0.40)
Bilirubin Total: 0.4 mg/dL (ref 0.0–1.2)
TOTAL PROTEIN: 7 g/dL (ref 6.0–8.5)

## 2016-06-23 LAB — LIPID PANEL
CHOLESTEROL TOTAL: 179 mg/dL (ref 100–199)
Chol/HDL Ratio: 5 ratio units (ref 0.0–5.0)
HDL: 36 mg/dL — AB (ref >39)
LDL Calculated: 115 mg/dL — ABNORMAL HIGH (ref 0–99)
TRIGLYCERIDES: 140 mg/dL (ref 0–149)
VLDL Cholesterol Cal: 28 mg/dL (ref 5–40)

## 2016-06-23 LAB — MICROALBUMIN / CREATININE URINE RATIO
Creatinine, Urine: 81 mg/dL
Microalb/Creat Ratio: 41.2 mg/g creat — ABNORMAL HIGH (ref 0.0–30.0)
Microalbumin, Urine: 33.4 ug/mL

## 2016-06-23 LAB — PSA: PROSTATE SPECIFIC AG, SERUM: 1.3 ng/mL (ref 0.0–4.0)

## 2016-06-25 ENCOUNTER — Encounter: Payer: Self-pay | Admitting: Family Medicine

## 2016-07-11 LAB — HM DIABETES EYE EXAM

## 2016-08-07 ENCOUNTER — Telehealth: Payer: Self-pay | Admitting: Family Medicine

## 2016-08-07 NOTE — Telephone Encounter (Signed)
Patient says that he wants it to be noted on his account that his insurance covers [facility] and not [facility].

## 2016-08-09 DIAGNOSIS — N183 Chronic kidney disease, stage 3 (moderate): Secondary | ICD-10-CM | POA: Diagnosis not present

## 2016-08-09 DIAGNOSIS — N2581 Secondary hyperparathyroidism of renal origin: Secondary | ICD-10-CM | POA: Diagnosis not present

## 2016-08-16 DIAGNOSIS — I129 Hypertensive chronic kidney disease with stage 1 through stage 4 chronic kidney disease, or unspecified chronic kidney disease: Secondary | ICD-10-CM | POA: Diagnosis not present

## 2016-08-16 DIAGNOSIS — D631 Anemia in chronic kidney disease: Secondary | ICD-10-CM | POA: Diagnosis not present

## 2016-08-16 DIAGNOSIS — N183 Chronic kidney disease, stage 3 (moderate): Secondary | ICD-10-CM | POA: Diagnosis not present

## 2016-08-16 DIAGNOSIS — N2581 Secondary hyperparathyroidism of renal origin: Secondary | ICD-10-CM | POA: Diagnosis not present

## 2016-08-17 ENCOUNTER — Other Ambulatory Visit: Payer: Self-pay | Admitting: *Deleted

## 2016-08-17 ENCOUNTER — Telehealth: Payer: Self-pay | Admitting: Family Medicine

## 2016-08-17 MED ORDER — PRAVASTATIN SODIUM 40 MG PO TABS: 40.0000 mg | ORAL_TABLET | Freq: Every day | ORAL | 0 refills | Status: DC

## 2016-08-17 NOTE — Telephone Encounter (Signed)
Prescription sent electronically to pharmacy. Patient notified. 

## 2016-08-17 NOTE — Telephone Encounter (Signed)
Patient requesting refill on pravastatin 40 mg with refills  To be called into [facility].

## 2016-09-12 DIAGNOSIS — N183 Chronic kidney disease, stage 3 (moderate): Secondary | ICD-10-CM | POA: Diagnosis not present

## 2016-09-17 DIAGNOSIS — N2581 Secondary hyperparathyroidism of renal origin: Secondary | ICD-10-CM | POA: Diagnosis not present

## 2016-09-17 DIAGNOSIS — I129 Hypertensive chronic kidney disease with stage 1 through stage 4 chronic kidney disease, or unspecified chronic kidney disease: Secondary | ICD-10-CM | POA: Diagnosis not present

## 2016-09-17 DIAGNOSIS — N183 Chronic kidney disease, stage 3 (moderate): Secondary | ICD-10-CM | POA: Diagnosis not present

## 2016-09-17 DIAGNOSIS — Z6830 Body mass index (BMI) 30.0-30.9, adult: Secondary | ICD-10-CM | POA: Diagnosis not present

## 2016-09-17 DIAGNOSIS — D631 Anemia in chronic kidney disease: Secondary | ICD-10-CM | POA: Diagnosis not present

## 2016-09-19 ENCOUNTER — Other Ambulatory Visit: Payer: Self-pay | Admitting: Nephrology

## 2016-09-19 DIAGNOSIS — N183 Chronic kidney disease, stage 3 unspecified: Secondary | ICD-10-CM

## 2016-09-19 DIAGNOSIS — I10 Essential (primary) hypertension: Secondary | ICD-10-CM

## 2016-09-28 ENCOUNTER — Ambulatory Visit
Admission: RE | Admit: 2016-09-28 | Discharge: 2016-09-28 | Disposition: A | Discharge status: Home / Self Care | Payer: PPO | Source: Ambulatory Visit | Attending: Nephrology | Admitting: Nephrology

## 2016-09-28 DIAGNOSIS — N183 Chronic kidney disease, stage 3 unspecified: Secondary | ICD-10-CM

## 2016-09-28 DIAGNOSIS — I10 Essential (primary) hypertension: Secondary | ICD-10-CM

## 2016-10-18 DIAGNOSIS — N183 Chronic kidney disease, stage 3 (moderate): Secondary | ICD-10-CM | POA: Diagnosis not present

## 2016-10-18 DIAGNOSIS — D4102 Neoplasm of uncertain behavior of left kidney: Secondary | ICD-10-CM | POA: Diagnosis not present

## 2016-10-18 DIAGNOSIS — N270 Small kidney, unilateral: Secondary | ICD-10-CM | POA: Diagnosis not present

## 2016-10-25 DIAGNOSIS — D4102 Neoplasm of uncertain behavior of left kidney: Secondary | ICD-10-CM | POA: Diagnosis not present

## 2016-10-25 DIAGNOSIS — N200 Calculus of kidney: Secondary | ICD-10-CM | POA: Diagnosis not present

## 2016-11-29 DIAGNOSIS — N189 Chronic kidney disease, unspecified: Secondary | ICD-10-CM | POA: Diagnosis not present

## 2016-11-29 DIAGNOSIS — N183 Chronic kidney disease, stage 3 (moderate): Secondary | ICD-10-CM | POA: Diagnosis not present

## 2016-11-29 DIAGNOSIS — N2581 Secondary hyperparathyroidism of renal origin: Secondary | ICD-10-CM | POA: Diagnosis not present

## 2016-11-29 DIAGNOSIS — D631 Anemia in chronic kidney disease: Secondary | ICD-10-CM | POA: Diagnosis not present

## 2016-12-04 DIAGNOSIS — N183 Chronic kidney disease, stage 3 (moderate): Secondary | ICD-10-CM | POA: Diagnosis not present

## 2016-12-04 DIAGNOSIS — I129 Hypertensive chronic kidney disease with stage 1 through stage 4 chronic kidney disease, or unspecified chronic kidney disease: Secondary | ICD-10-CM | POA: Diagnosis not present

## 2016-12-04 DIAGNOSIS — D631 Anemia in chronic kidney disease: Secondary | ICD-10-CM | POA: Diagnosis not present

## 2016-12-04 DIAGNOSIS — N2581 Secondary hyperparathyroidism of renal origin: Secondary | ICD-10-CM | POA: Diagnosis not present

## 2016-12-04 DIAGNOSIS — Z6830 Body mass index (BMI) 30.0-30.9, adult: Secondary | ICD-10-CM | POA: Diagnosis not present

## 2016-12-10 DIAGNOSIS — N183 Chronic kidney disease, stage 3 (moderate): Secondary | ICD-10-CM | POA: Diagnosis not present

## 2016-12-10 DIAGNOSIS — Q5529 Other congenital malformations of testis and scrotum: Secondary | ICD-10-CM | POA: Diagnosis not present

## 2016-12-10 DIAGNOSIS — N270 Small kidney, unilateral: Secondary | ICD-10-CM | POA: Diagnosis not present

## 2016-12-10 DIAGNOSIS — D4102 Neoplasm of uncertain behavior of left kidney: Secondary | ICD-10-CM | POA: Diagnosis not present

## 2016-12-11 ENCOUNTER — Encounter: Payer: Self-pay | Admitting: Family Medicine

## 2016-12-11 ENCOUNTER — Ambulatory Visit (INDEPENDENT_AMBULATORY_CARE_PROVIDER_SITE_OTHER): Payer: PPO | Admitting: Family Medicine

## 2016-12-11 VITALS — BP 128/74 | Ht 67.0 in | Wt 176.0 lb

## 2016-12-11 DIAGNOSIS — E785 Hyperlipidemia, unspecified: Secondary | ICD-10-CM

## 2016-12-11 DIAGNOSIS — M10071 Idiopathic gout, right ankle and foot: Secondary | ICD-10-CM | POA: Diagnosis not present

## 2016-12-11 DIAGNOSIS — E119 Type 2 diabetes mellitus without complications: Secondary | ICD-10-CM

## 2016-12-11 DIAGNOSIS — Z79899 Other long term (current) drug therapy: Secondary | ICD-10-CM

## 2016-12-11 LAB — POCT GLYCOSYLATED HEMOGLOBIN (HGB A1C): Hemoglobin A1C: 5.2

## 2016-12-11 MED ORDER — AMLODIPINE BESYLATE 10 MG PO TABS: 10.0000 mg | ORAL_TABLET | Freq: Every day | ORAL | 1 refills | Status: DC

## 2016-12-11 MED ORDER — PREDNISONE 20 MG PO TABS: ORAL_TABLET | ORAL | 0 refills | Status: DC

## 2016-12-11 MED ORDER — FENOFIBRATE 160 MG PO TABS: 160.0000 mg | ORAL_TABLET | Freq: Every day | ORAL | 1 refills | Status: DC

## 2016-12-11 MED ORDER — PRAVASTATIN SODIUM 40 MG PO TABS: 40.0000 mg | ORAL_TABLET | Freq: Every day | ORAL | 1 refills | Status: DC

## 2016-12-11 MED ORDER — CARVEDILOL 6.25 MG PO TABS: ORAL_TABLET | ORAL | 1 refills | Status: DC
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
## 2016-12-26 ENCOUNTER — Other Ambulatory Visit: Payer: Self-pay | Admitting: Family Medicine

## 2016-12-26 DIAGNOSIS — E785 Hyperlipidemia, unspecified: Secondary | ICD-10-CM | POA: Diagnosis not present

## 2016-12-26 DIAGNOSIS — Z79899 Other long term (current) drug therapy: Secondary | ICD-10-CM | POA: Diagnosis not present

## 2016-12-26 LAB — LIPID PANEL
Cholesterol: 201 mg/dL — ABNORMAL HIGH (ref <200)
HDL: 36 mg/dL — ABNORMAL LOW (ref >40)
LDL CHOLESTEROL (CALC): 138 mg/dL — AB
NON-HDL CHOLESTEROL (CALC): 165 mg/dL — AB (ref <130)
TRIGLYCERIDES: 142 mg/dL (ref <150)
Total CHOL/HDL Ratio: 5.6 (calc) — ABNORMAL HIGH (ref <5.0)

## 2016-12-26 LAB — HEPATIC FUNCTION PANEL
AG RATIO: 1.7 (calc) (ref 1.0–2.5)
ALT: 22 U/L (ref 9–46)
AST: 22 U/L (ref 10–35)
Albumin: 4 g/dL (ref 3.6–5.1)
Alkaline phosphatase (APISO): 54 U/L (ref 40–115)
BILIRUBIN INDIRECT: 0.5 mg/dL (ref 0.2–1.2)
Bilirubin, Direct: 0.1 mg/dL (ref 0.0–0.2)
Globulin: 2.4 g/dL (calc) (ref 1.9–3.7)
TOTAL PROTEIN: 6.4 g/dL (ref 6.1–8.1)
Total Bilirubin: 0.6 mg/dL (ref 0.2–1.2)

## 2017-01-01 MED ORDER — ATORVASTATIN CALCIUM 40 MG PO TABS: ORAL_TABLET | ORAL | 0 refills | Status: DC

## 2017-01-01 NOTE — Addendum Note (Signed)
Addended by: Ofilia Neas R on: 01/01/2017 08:50 AM   Modules accepted: Orders

## 2017-01-04 ENCOUNTER — Other Ambulatory Visit: Payer: Self-pay | Admitting: Family Medicine

## 2017-03-06 DIAGNOSIS — N189 Chronic kidney disease, unspecified: Secondary | ICD-10-CM | POA: Diagnosis not present

## 2017-03-06 DIAGNOSIS — N2581 Secondary hyperparathyroidism of renal origin: Secondary | ICD-10-CM | POA: Diagnosis not present

## 2017-03-06 DIAGNOSIS — N183 Chronic kidney disease, stage 3 (moderate): Secondary | ICD-10-CM | POA: Diagnosis not present

## 2017-03-12 DIAGNOSIS — D631 Anemia in chronic kidney disease: Secondary | ICD-10-CM | POA: Diagnosis not present

## 2017-03-12 DIAGNOSIS — N2581 Secondary hyperparathyroidism of renal origin: Secondary | ICD-10-CM | POA: Diagnosis not present

## 2017-03-12 DIAGNOSIS — N183 Chronic kidney disease, stage 3 (moderate): Secondary | ICD-10-CM | POA: Diagnosis not present

## 2017-03-12 DIAGNOSIS — I129 Hypertensive chronic kidney disease with stage 1 through stage 4 chronic kidney disease, or unspecified chronic kidney disease: Secondary | ICD-10-CM | POA: Diagnosis not present

## 2017-03-21 DIAGNOSIS — H524 Presbyopia: Secondary | ICD-10-CM | POA: Diagnosis not present

## 2017-03-22 IMAGING — CT CT ABD-PELV W/O CM
2 of 4 series · 15 of 46 positions shown, 17 images · non-contrast
Comparison: Renal ultrasound performed 11/30/2011

CLINICAL DATA: Acute onset of nausea, vomiting and jaundice.
Initial encounter.

EXAM:
CT ABDOMEN AND PELVIS WITHOUT CONTRAST
TECHNIQUE: Multidetector CT imaging of the abdomen and pelvis was performed
following the standard protocol without IV contrast.

[Series 2: abdomen/pelvis w/o contrast · axial · non-contrast · 0.74mm/px · z∈[-485,-25]mm · 12 of 102 slices shown, 14 images]
[im 5/102  soft-tissue]
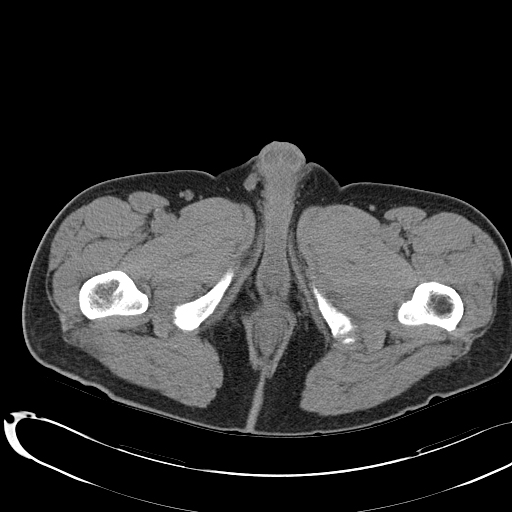
[im 5/102  bone]
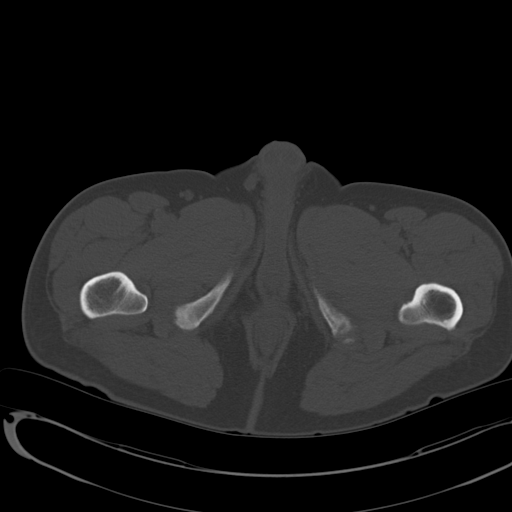
[im 14/102  soft-tissue]
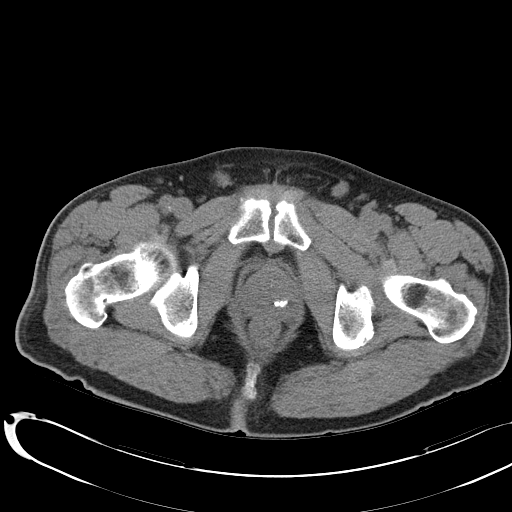
[im 22/102  soft-tissue]
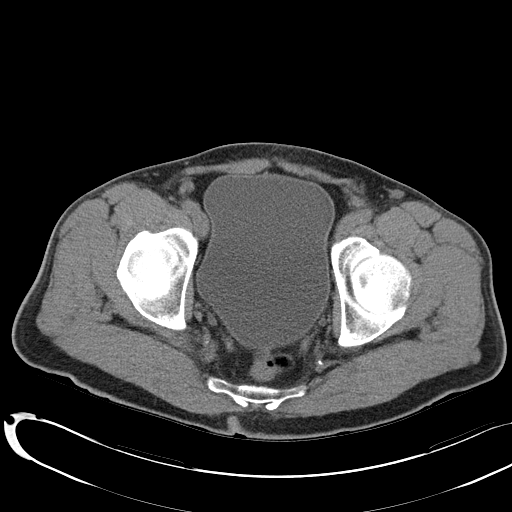
[im 31/102  soft-tissue]
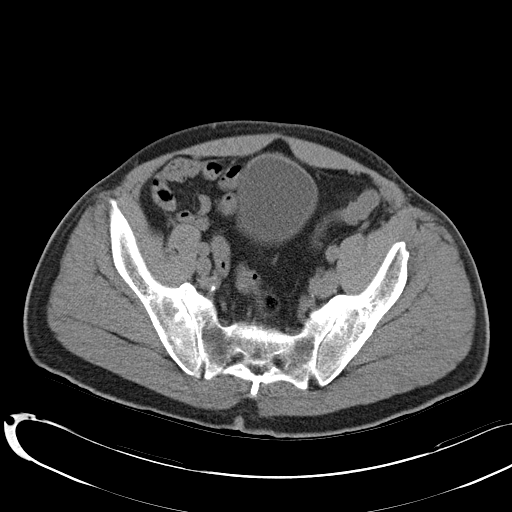
[im 40/102  soft-tissue]
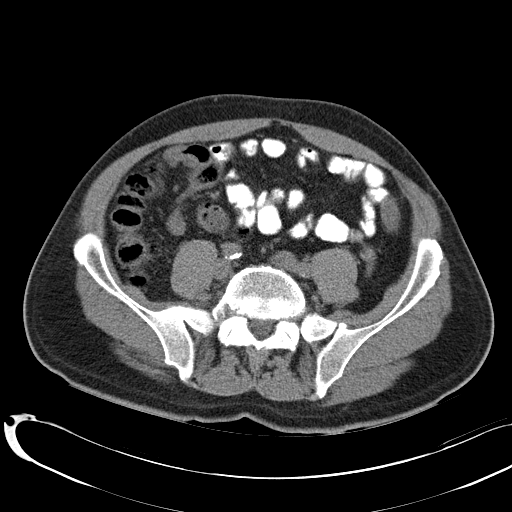
[im 49/102  soft-tissue]
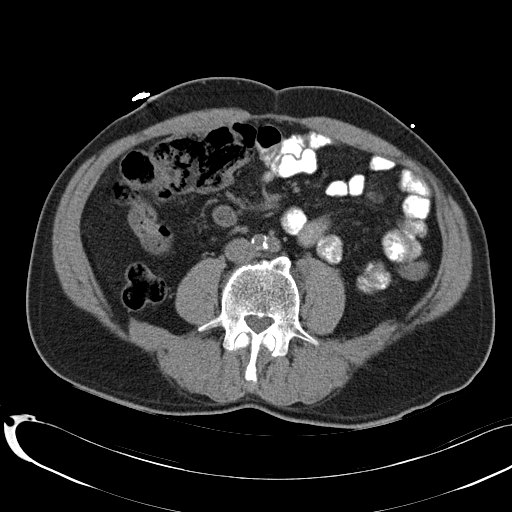
[im 53/102  soft-tissue]
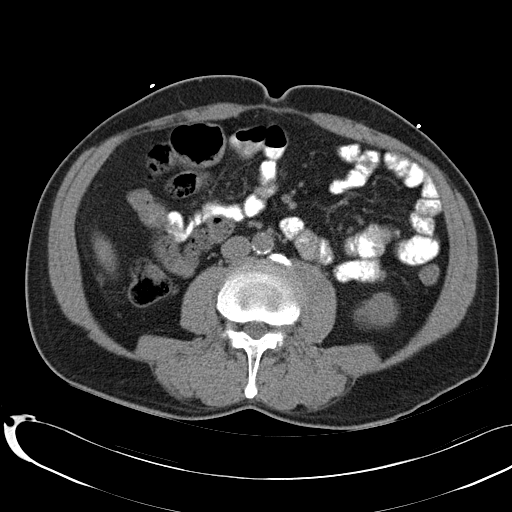
[im 62/102  soft-tissue]
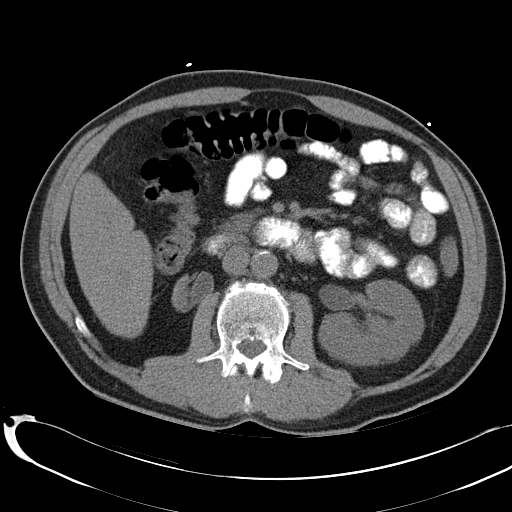
[im 71/102  soft-tissue]
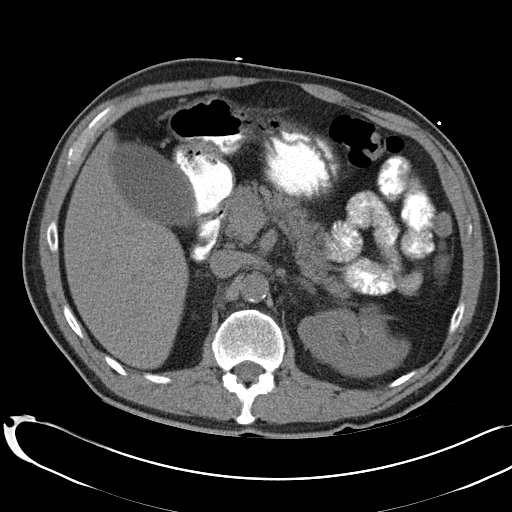
[im 71/102  bone]
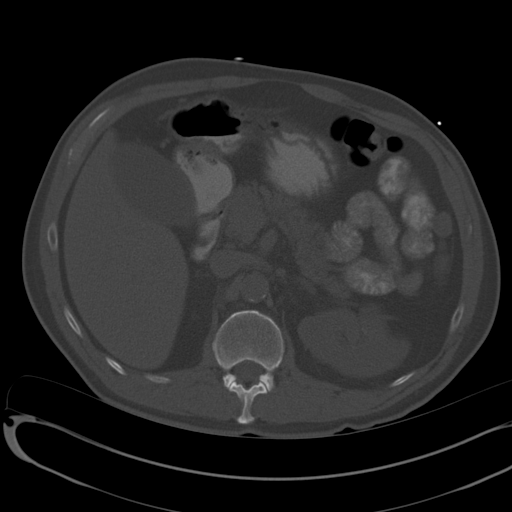
[im 80/102  soft-tissue]
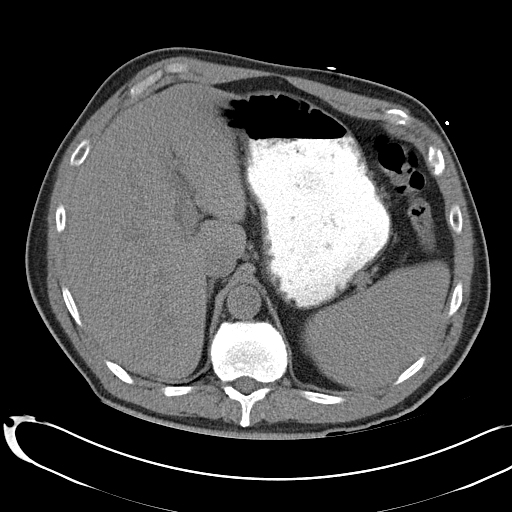
[im 88/102  soft-tissue]
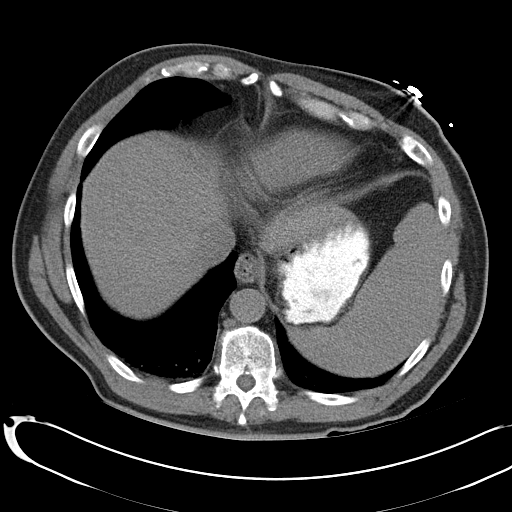
[im 97/102  soft-tissue]
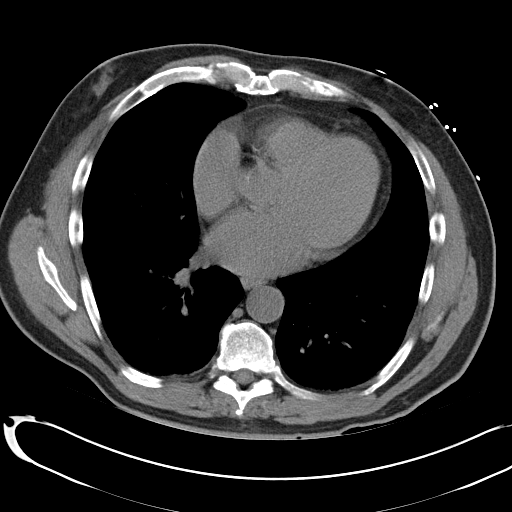

[Series 3: mpr cor (id) · coronal · 0.73mm/px · 3 of 102 slices shown]
[im 34/102  soft-tissue]
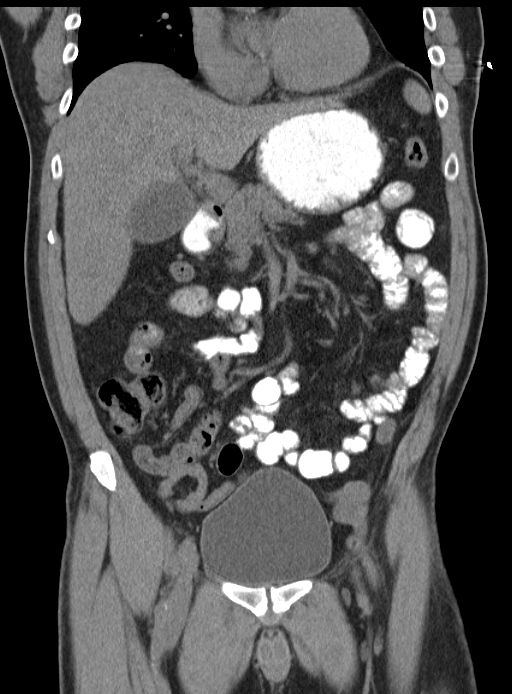
[im 45/102  soft-tissue]
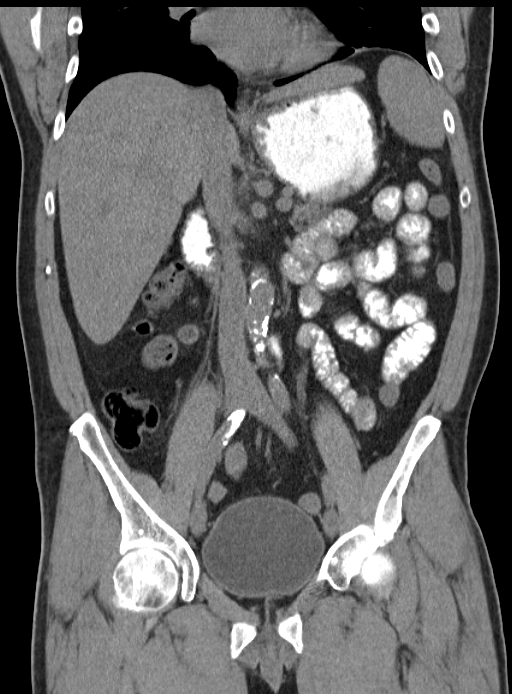
[im 57/102  soft-tissue]
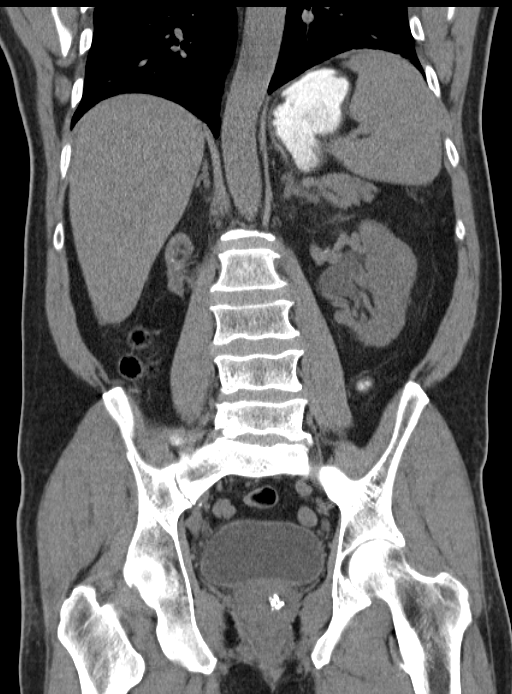

[15 of 46 positions shown; findings below may reference images not displayed]

FINDINGS: Minimal bibasilar atelectasis is noted. Scattered coronary artery
calcifications are seen.

The gallbladder is somewhat distended, with mild gallbladder wall
thickening. There is suggestion of stones within the gallbladder,
though this is not well seen. The common bile duct and intrahepatic
biliary ducts remain grossly normal in caliber, though difficult to
fully assess without contrast. This raises concern for obstruction
at the level of the cystic duct, given the patient's elevated
bilirubin.

The liver is unremarkable in appearance. The spleen is enlarged,
measuring 14.7 cm in length. Vague central areas of decreased
attenuation within the spleen are nonspecific and may simply reflect
extension of hilar fat. The pancreas and adrenal glands are
unremarkable.

There is marked chronic atrophy of the right kidney, with mild
associated calcification. There is compensatory hypertrophy of the
left kidney, with underlying scarring. A small calcification at the
left renal hilum is thought to be vascular in nature. No renal or
ureteral stones are identified. Minimal nonspecific perinephric
stranding is noted on the left. There is no evidence of
hydronephrosis.

No free fluid is identified. The small bowel is unremarkable in
appearance. The stomach is within normal limits. No acute vascular
abnormalities are seen. Scattered calcification is seen along the
abdominal aorta and its branches.

The appendix is normal in caliber, without evidence for
appendicitis. The colon is unremarkable in appearance.

The bladder is moderately distended and grossly unremarkable. The
prostate remains normal in size, with scattered calcification. No
inguinal lymphadenopathy is seen.

No acute osseous abnormalities are identified.
IMPRESSION: 1. Gallbladder somewhat distended, with mild gallbladder wall
thickening. Suggestion of stones within the gallbladder, though this
is not well seen on CT. The common bile duct and intrahepatic
biliary ducts remain grossly normal in caliber, though difficult to
fully assess without contrast. This raises concern for obstruction
at the level of the cystic duct, given the patient's elevated
bilirubin.
2. Splenomegaly noted. Vague central areas of decreased attenuation
at the spleen are nonspecific and may reflect normal hilar fat.
3. Scattered coronary artery calcifications seen.
4. Marked chronic atrophy of the right kidney, with mild associated
calcification. Compensatory hypertrophy of the left kidney, with
underlying scarring.
5. Scattered calcification along the abdominal aorta and its
branches.

## 2017-03-26 IMAGING — RF DG CHOLANGIOGRAM OPERATIVE
1 series · 4 of 4 positions shown · non-contrast
Comparison: Abdominal ultrasound 09/29/2014

CLINICAL DATA: 63-year-old male with cholelithiasis undergoing
laparoscopic cholecystectomy.

EXAM:
INTRAOPERATIVE CHOLANGIOGRAM
TECHNIQUE: Cholangiographic images from the C-arm fluoroscopic device were
submitted for interpretation post-operatively. Please see the
procedural report for the amount of contrast and the fluoroscopy
time utilized.

[Series 1: run · 4 of 75 frames shown]
[frame 12/75]
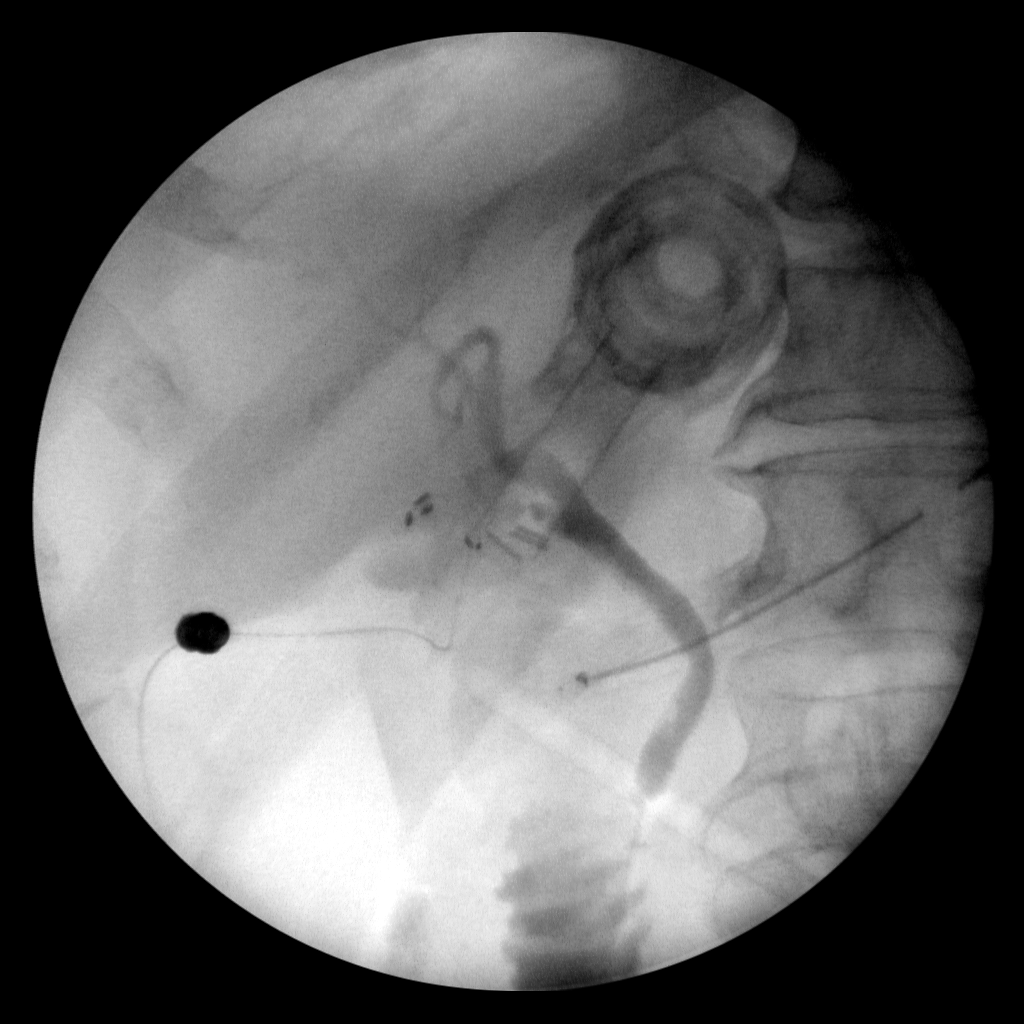
[frame 38/75]
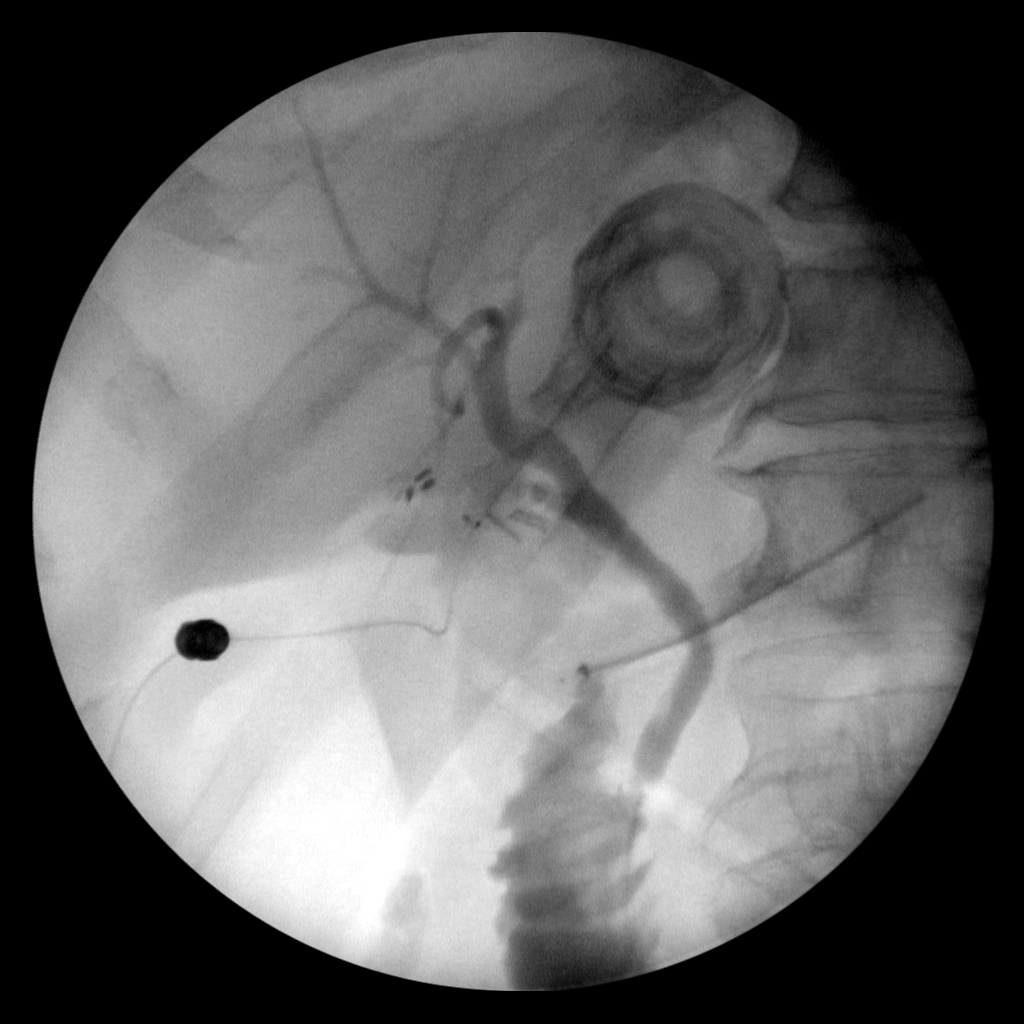
[frame 64/75]
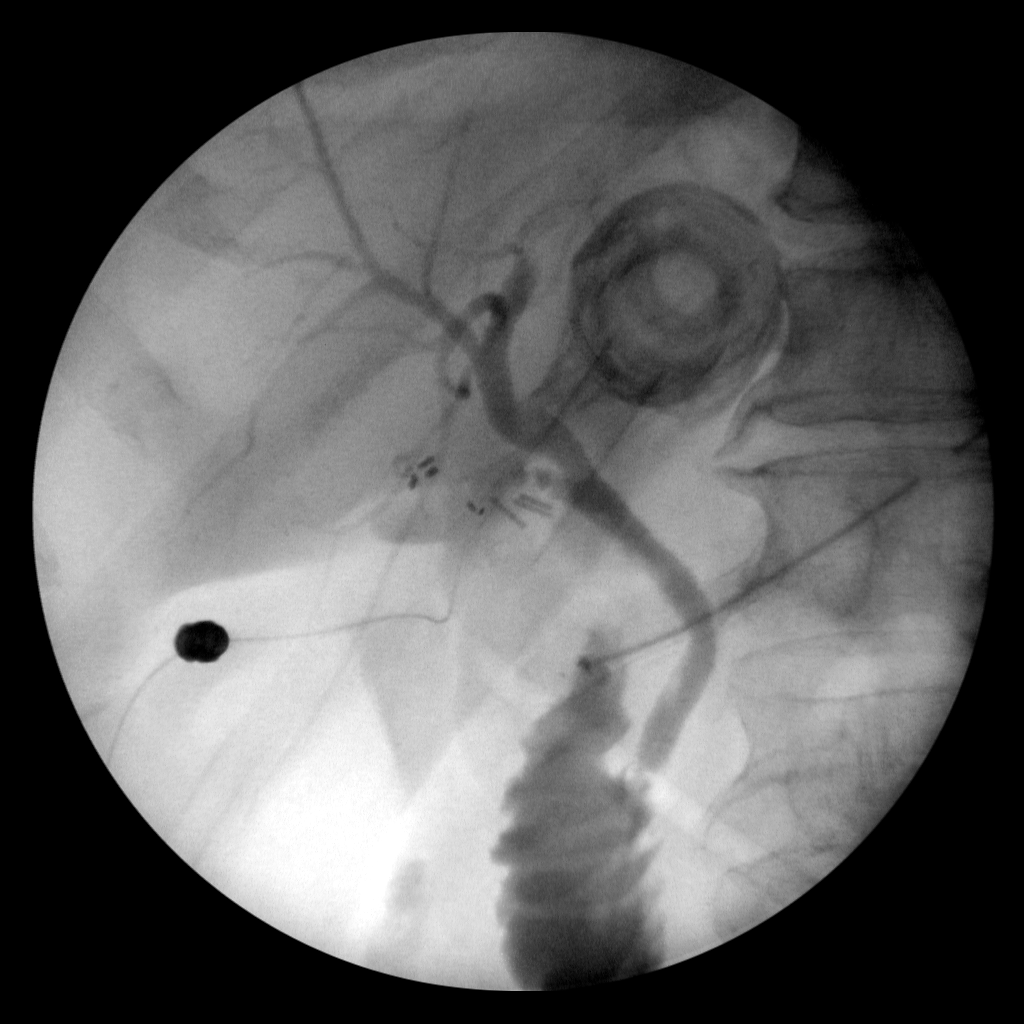
[frame 71/75]
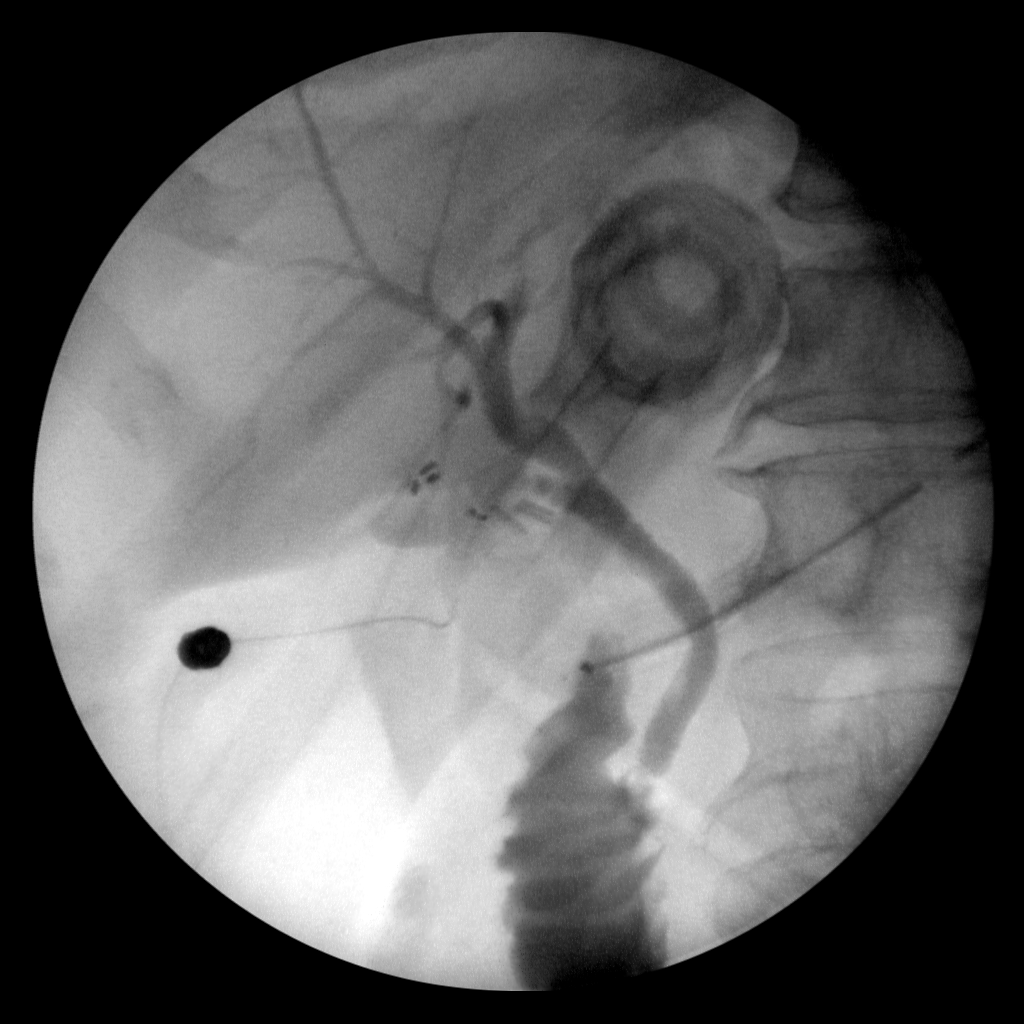

[4 of 4 positions shown; findings below may reference images not displayed]

FINDINGS: Cine clip obtained during intraoperative cholangiogram at the time
of laparoscopic cholecystectomy demonstrates cannulation of the
cystic duct remnant with opacification of the intra and extrahepatic
biliary tree. No evidence of biliary dilatation, stenosis,
stricture, or choledocholithiasis. Contrast material passes freely
through the ampulla and into the duodenum.
IMPRESSION: Negative intraoperative cholangiogram.

## 2017-04-03 ENCOUNTER — Other Ambulatory Visit: Payer: Self-pay | Admitting: Family Medicine

## 2017-06-17 ENCOUNTER — Ambulatory Visit (INDEPENDENT_AMBULATORY_CARE_PROVIDER_SITE_OTHER): Payer: PPO | Admitting: Family Medicine

## 2017-06-17 ENCOUNTER — Encounter: Payer: Self-pay | Admitting: Family Medicine

## 2017-06-17 VITALS — BP 134/80 | Ht 67.0 in | Wt 182.6 lb

## 2017-06-17 DIAGNOSIS — Z0000 Encounter for general adult medical examination without abnormal findings: Secondary | ICD-10-CM

## 2017-06-17 DIAGNOSIS — E785 Hyperlipidemia, unspecified: Secondary | ICD-10-CM | POA: Diagnosis not present

## 2017-06-17 DIAGNOSIS — E119 Type 2 diabetes mellitus without complications: Secondary | ICD-10-CM

## 2017-06-17 DIAGNOSIS — I10 Essential (primary) hypertension: Secondary | ICD-10-CM | POA: Diagnosis not present

## 2017-06-17 MED ORDER — AMLODIPINE BESYLATE 10 MG PO TABS: 10.0000 mg | ORAL_TABLET | Freq: Every day | ORAL | 1 refills | Status: DC

## 2017-06-17 MED ORDER — CARVEDILOL 6.25 MG PO TABS: ORAL_TABLET | ORAL | 1 refills | Status: DC

## 2017-06-17 MED ORDER — ATORVASTATIN CALCIUM 40 MG PO TABS: ORAL_TABLET | ORAL | 1 refills | Status: DC

## 2017-06-17 MED ORDER — FENOFIBRATE 160 MG PO TABS: 160.0000 mg | ORAL_TABLET | Freq: Every day | ORAL | 1 refills | Status: DC

## 2017-06-17 NOTE — Progress Notes (Signed)
Subjective:    Patient ID: Hunter EBRON Sr., male    DOB: 1951/02/10, 67 y.o.   MRN: [id number]  HPI AWV- Annual Wellness Visit  The patient was seen for their annual wellness visit. The patient's past medical history, surgical history, and family history were reviewed. Pertinent vaccines were reviewed ( tetanus, pneumonia, shingles, flu) The patient's medication list was reviewed and updated.  The height and weight were entered.  BMI recorded in electronic record elsewhere  Cognitive screening was completed. Outcome of Mini - Cog: Pass   Falls /depression screening electronically recorded within record elsewhere  Current tobacco usage:none (All patients who use tobacco were given written and verbal information on quitting)  Recent listing of emergency department/hospitalizations over the past year were reviewed.  current specialist the patient sees on a regular basis: [facility]   Medicare annual wellness visit patient questionnaire was reviewed.  A written screening schedule for the patient for the next 5-10 years was given. Appropriate discussion of followup regarding next visit was discussed.   Results for orders placed or performed in visit on 12/26/16  Lipid panel  Result Value Ref Range   Cholesterol 201 (H) <200 mg/dL   HDL 36 (L) >40 mg/dL   Triglycerides 142 <150 mg/dL   LDL Cholesterol (Calc) 138 (H) mg/dL (calc)   Total CHOL/HDL Ratio 5.6 (H) <5.0 (calc)   Non-HDL Cholesterol (Calc) 165 (H) <130 mg/dL (calc)  Hepatic function panel  Result Value Ref Range   Total Protein 6.4 6.1 - 8.1 g/dL   Albumin 4.0 3.6 - 5.1 g/dL   Globulin 2.4 1.9 - 3.7 g/dL (calc)   AG Ratio 1.7 1.0 - 2.5 (calc)   Total Bilirubin 0.6 0.2 - 1.2 mg/dL   Bilirubin, Direct 0.1 0.0 - 0.2 mg/dL   Indirect Bilirubin 0.5 0.2 - 1.2 mg/dL (calc)   Alkaline phosphatase (APISO) 54 40 - 115 U/L   AST 22 10 - 35 U/L   ALT 22 9 - 46 U/L    Patient continues to take lipid medication  regularly. No obvious side effects from it. Generally does not miss a dose. Prior blood work results are reviewed with patient. Patient continues to work on fat intake in diet  Blood pressure medicine and blood pressure levels reviewed today with patient. Compliant with blood pressure medicine. States does not miss a dose. No obvious side effects. Blood pressure generally good when checked elsewhere. Watching salt intake.   Patient claims compliance with diabetes medication. No obvious side effects. Reports no substantial low sugar spells. Most numbers are generally in good range when checked fasting. Generally does not miss a dose of medication. Watching diabetic diet closely   rxercising reg  Due aga Review of Systems  Constitutional: Negative for activity change, appetite change and fever.  HENT: Negative for congestion and rhinorrhea.   Eyes: Negative for discharge.  Respiratory: Negative for cough and wheezing.   Cardiovascular: Negative for chest pain.  Gastrointestinal: Negative for abdominal pain, blood in stool and vomiting.  Genitourinary: Negative for difficulty urinating and frequency.  Musculoskeletal: Negative for neck pain.  Skin: Negative for rash.  Allergic/Immunologic: Negative for environmental allergies and food allergies.  Neurological: Negative for weakness and headaches.  Psychiatric/Behavioral: Negative for agitation.  All other systems reviewed and are negative.      Objective:   Physical Exam  Constitutional: He appears well-developed and well-nourished.  HENT:  Head: Normocephalic and atraumatic.  Right Ear: External ear normal.  Left Ear:  External ear normal.  Nose: Nose normal.  Mouth/Throat: Oropharynx is clear and moist.  Eyes: Right eye exhibits no discharge. Left eye exhibits no discharge. No scleral icterus.  Neck: Normal range of motion. Neck supple. No thyromegaly present.  Cardiovascular: Normal rate, regular rhythm and normal heart sounds.    No murmur heard. Pulmonary/Chest: Effort normal and breath sounds normal. No respiratory distress. He has no wheezes.  Abdominal: Soft. Bowel sounds are normal. He exhibits no distension and no mass. There is no tenderness.  Genitourinary: Penis normal.  Musculoskeletal: Normal range of motion. He exhibits no edema.  Lymphadenopathy:    He has no cervical adenopathy.  Neurological: He is alert. He exhibits normal muscle tone. Coordination normal.  Skin: Skin is warm and dry. No erythema.  Psychiatric: He has a normal mood and affect. His behavior is normal. Judgment normal.  Vitals reviewed.         Assessment & Plan:  Colon next due 20121/ pt declines all   Vaccines today  Impression wellness exam. 3100 patient declines all vaccines.  Diet discussed.  Exercise discussed.  2.  Hypertension good control discussed maintain same meds compliance discussed  3.  Type 2 diabetes.  Control overall good.  Compliance discussed diet exercise discussed

## 2017-07-05 ENCOUNTER — Other Ambulatory Visit: Payer: Self-pay

## 2017-07-05 MED ORDER — FENOFIBRATE 160 MG PO TABS: 160.0000 mg | ORAL_TABLET | Freq: Every day | ORAL | 1 refills | Status: DC

## 2017-08-05 ENCOUNTER — Other Ambulatory Visit: Payer: Self-pay | Admitting: Family Medicine

## 2017-08-05 DIAGNOSIS — E785 Hyperlipidemia, unspecified: Secondary | ICD-10-CM | POA: Diagnosis not present

## 2017-08-05 DIAGNOSIS — E119 Type 2 diabetes mellitus without complications: Secondary | ICD-10-CM | POA: Diagnosis not present

## 2017-08-06 LAB — HEPATIC FUNCTION PANEL
AG Ratio: 2 (calc) (ref 1.0–2.5)
ALBUMIN MSPROF: 4.3 g/dL (ref 3.6–5.1)
ALT: 20 U/L (ref 9–46)
AST: 25 U/L (ref 10–35)
Alkaline phosphatase (APISO): 49 U/L (ref 40–115)
BILIRUBIN DIRECT: 0.2 mg/dL (ref 0.0–0.2)
BILIRUBIN TOTAL: 0.5 mg/dL (ref 0.2–1.2)
GLOBULIN: 2.2 g/dL (ref 1.9–3.7)
Indirect Bilirubin: 0.3 mg/dL (calc) (ref 0.2–1.2)
Total Protein: 6.5 g/dL (ref 6.1–8.1)

## 2017-08-06 LAB — BASIC METABOLIC PANEL WITH GFR
BUN/Creatinine Ratio: 14 (calc) (ref 6–22)
BUN: 22 mg/dL (ref 7–25)
CHLORIDE: 108 mmol/L (ref 98–110)
CO2: 26 mmol/L (ref 20–32)
Calcium: 9.2 mg/dL (ref 8.6–10.3)
Creat: 1.58 mg/dL — ABNORMAL HIGH (ref 0.70–1.25)
GFR, EST AFRICAN AMERICAN: 52 mL/min/{1.73_m2} — AB (ref >=60)
GFR, EST NON AFRICAN AMERICAN: 45 mL/min/{1.73_m2} — AB (ref >=60)
Glucose, Bld: 131 mg/dL — ABNORMAL HIGH (ref 65–99)
Potassium: 4.3 mmol/L (ref 3.5–5.3)
Sodium: 142 mmol/L (ref 135–146)

## 2017-08-06 LAB — LIPID PANEL
CHOLESTEROL: 133 mg/dL (ref <200)
HDL: 36 mg/dL — AB (ref >40)
LDL Cholesterol (Calc): 79 mg/dL (calc)
Non-HDL Cholesterol (Calc): 97 mg/dL (calc) (ref <130)
Total CHOL/HDL Ratio: 3.7 (calc) (ref <5.0)
Triglycerides: 95 mg/dL (ref <150)

## 2017-08-06 LAB — HEMOGLOBIN A1C
EAG (MMOL/L): 7 (calc)
Hgb A1c MFr Bld: 6 % of total Hgb — ABNORMAL HIGH (ref <5.7)
MEAN PLASMA GLUCOSE: 126 (calc)

## 2017-08-11 ENCOUNTER — Encounter: Payer: Self-pay | Admitting: Family Medicine

## 2017-10-04 DIAGNOSIS — N189 Chronic kidney disease, unspecified: Secondary | ICD-10-CM | POA: Diagnosis not present

## 2017-10-07 DIAGNOSIS — D631 Anemia in chronic kidney disease: Secondary | ICD-10-CM | POA: Diagnosis not present

## 2017-10-07 DIAGNOSIS — N183 Chronic kidney disease, stage 3 (moderate): Secondary | ICD-10-CM | POA: Diagnosis not present

## 2017-10-07 DIAGNOSIS — I129 Hypertensive chronic kidney disease with stage 1 through stage 4 chronic kidney disease, or unspecified chronic kidney disease: Secondary | ICD-10-CM | POA: Diagnosis not present

## 2017-10-07 DIAGNOSIS — N2581 Secondary hyperparathyroidism of renal origin: Secondary | ICD-10-CM | POA: Diagnosis not present

## 2017-12-18 ENCOUNTER — Encounter: Payer: Self-pay | Admitting: Family Medicine

## 2017-12-18 ENCOUNTER — Ambulatory Visit (INDEPENDENT_AMBULATORY_CARE_PROVIDER_SITE_OTHER): Payer: PPO | Admitting: Family Medicine

## 2017-12-18 VITALS — BP 122/78 | Ht 67.0 in | Wt 176.4 lb

## 2017-12-18 DIAGNOSIS — E785 Hyperlipidemia, unspecified: Secondary | ICD-10-CM

## 2017-12-18 DIAGNOSIS — E119 Type 2 diabetes mellitus without complications: Secondary | ICD-10-CM | POA: Diagnosis not present

## 2017-12-18 DIAGNOSIS — Z125 Encounter for screening for malignant neoplasm of prostate: Secondary | ICD-10-CM | POA: Diagnosis not present

## 2017-12-18 DIAGNOSIS — N183 Chronic kidney disease, stage 3 unspecified: Secondary | ICD-10-CM

## 2017-12-18 DIAGNOSIS — I10 Essential (primary) hypertension: Secondary | ICD-10-CM | POA: Diagnosis not present

## 2017-12-18 LAB — POCT GLYCOSYLATED HEMOGLOBIN (HGB A1C): HEMOGLOBIN A1C: 5.1 % (ref 4.0–5.6)

## 2017-12-18 MED ORDER — ATORVASTATIN CALCIUM 40 MG PO TABS: ORAL_TABLET | ORAL | 1 refills | Status: DC

## 2017-12-18 MED ORDER — CARVEDILOL 6.25 MG PO TABS: ORAL_TABLET | ORAL | 1 refills | Status: DC

## 2017-12-18 MED ORDER — AMLODIPINE BESYLATE 10 MG PO TABS: 10.0000 mg | ORAL_TABLET | Freq: Every day | ORAL | 1 refills | Status: DC

## 2017-12-18 MED ORDER — FENOFIBRATE 160 MG PO TABS: 160.0000 mg | ORAL_TABLET | Freq: Every day | ORAL | 1 refills | Status: DC

## 2017-12-18 NOTE — Addendum Note (Signed)
Addended by: Dairl Ponder on: 12/18/2017 02:33 PM   Modules accepted: Orders

## 2017-12-18 NOTE — Progress Notes (Signed)
Subjective:    Patient ID: Hunter FURUYA Sr., male    DOB: [birth year], 67 y.o.   MRN: [id number] Patient arrives with multiple concerns Hypertension  This is a chronic problem. The current episode started more than 1 year ago. Risk factors for coronary artery disease include dyslipidemia. Treatments tried: norvasc and coreg. There are no compliance problems.     Patient claims compliance with diabetes medication. No obvious side effects. Reports no substantial low sugar spells. Most numbers are generally in good range when checked fasting. Generally does not miss a dose of medication. Watching diabetic diet closely   Results for orders placed or performed in visit on 08/05/17  Lipid panel  Result Value Ref Range   Cholesterol 133 <200 mg/dL   HDL 36 (L) >40 mg/dL   Triglycerides 95 <150 mg/dL   LDL Cholesterol (Calc) 79 mg/dL (calc)   Total CHOL/HDL Ratio 3.7 <5.0 (calc)   Non-HDL Cholesterol (Calc) 97 <130 mg/dL (calc)  BASIC METABOLIC PANEL WITH GFR  Result Value Ref Range   Glucose, Bld 131 (H) 65 - 99 mg/dL   BUN 22 7 - 25 mg/dL   Creat 1.58 (H) 0.70 - 1.25 mg/dL   GFR, Est Non African American 45 (L) > OR = 60 mL/min/1.67m2   GFR, Est African American 52 (L) > OR = 60 mL/min/1.4m2   BUN/Creatinine Ratio 14 6 - 22 (calc)   Sodium 142 135 - 146 mmol/L   Potassium 4.3 3.5 - 5.3 mmol/L   Chloride 108 98 - 110 mmol/L   CO2 26 20 - 32 mmol/L   Calcium 9.2 8.6 - 10.3 mg/dL  Hemoglobin A1c  Result Value Ref Range   Hgb A1c MFr Bld 6.0 (H) <5.7 % of total Hgb   Mean Plasma Glucose 126 (calc)   eAG (mmol/L) 7.0 (calc)  Hepatic function panel  Result Value Ref Range   Total Protein 6.5 6.1 - 8.1 g/dL   Albumin 4.3 3.6 - 5.1 g/dL   Globulin 2.2 1.9 - 3.7 g/dL (calc)   AG Ratio 2.0 1.0 - 2.5 (calc)   Total Bilirubin 0.5 0.2 - 1.2 mg/dL   Bilirubin, Direct 0.2 0.0 - 0.2 mg/dL   Indirect Bilirubin 0.3 0.2 - 1.2 mg/dL (calc)   Alkaline phosphatase (APISO) 49 40 - 115 U/L   AST  25 10 - 35 U/L   ALT 20 9 - 46 U/L   Glu every  100 or so when checked   Staying active   Blood pressure medicine and blood pressure levels reviewed today with patient. Compliant with blood pressure medicine. States does not miss a dose. No obvious side effects. Blood pressure generally good when checked elsewhere. Watching salt intake.   Patient continues to take lipid medication regularly. No obvious side effects from it. Generally does not miss a dose. Prior blood work results are reviewed with patient. Patient continues to work on fat intake in diet     Review of Systems No headache, no major weight loss or weight gain, no chest pain no back pain abdominal pain no change in bowel habits complete ROS otherwise negative     Objective:   Physical Exam  Alert and oriented, vitals reviewed and stable, NAD ENT-TM's and ext canals WNL bilat via otoscopic exam Soft palate, tonsils and post pharynx WNL via oropharyngeal exam Neck-symmetric, no masses; thyroid nonpalpable and nontender Pulmonary-no tachypnea or accessory muscle use; Clear without wheezes via auscultation Card--no abnrml murmurs, rhythm reg and rate  WNL Carotid pulses symmetric, without bruits         Assessment & Plan:  Declines flu shot  Impression 1 attention.  Good control discussed maintain same meds  2.  Type 2 diabetes.  Discussed.  A1c excellent.  Diet exercise discussed.  3.  Stage III chronic renal failure.  Status uncertain.  Will recheck blood work.  4.  Hyperlipidemia.  Prior blood work reviewed.  Patient maintain same for now.  Reevaluate blood work further recommendations based on results  Follow-up in 6 months for wellness plus chronic  Patient declines flu shot

## 2018-01-13 DIAGNOSIS — Z125 Encounter for screening for malignant neoplasm of prostate: Secondary | ICD-10-CM | POA: Diagnosis not present

## 2018-01-13 DIAGNOSIS — I10 Essential (primary) hypertension: Secondary | ICD-10-CM | POA: Diagnosis not present

## 2018-01-13 DIAGNOSIS — E785 Hyperlipidemia, unspecified: Secondary | ICD-10-CM | POA: Diagnosis not present

## 2018-01-13 DIAGNOSIS — E119 Type 2 diabetes mellitus without complications: Secondary | ICD-10-CM | POA: Diagnosis not present

## 2018-01-14 LAB — BASIC METABOLIC PANEL
BUN / CREAT RATIO: 18 (ref 10–24)
BUN: 33 mg/dL — ABNORMAL HIGH (ref 8–27)
CALCIUM: 9.5 mg/dL (ref 8.6–10.2)
CHLORIDE: 108 mmol/L — AB (ref 96–106)
CO2: 19 mmol/L — ABNORMAL LOW (ref 20–29)
Creatinine, Ser: 1.8 mg/dL — ABNORMAL HIGH (ref 0.76–1.27)
GFR calc non Af Amer: 38 mL/min/{1.73_m2} — ABNORMAL LOW (ref >59)
GFR, EST AFRICAN AMERICAN: 44 mL/min/{1.73_m2} — AB (ref >59)
Glucose: 136 mg/dL — ABNORMAL HIGH (ref 65–99)
POTASSIUM: 4.5 mmol/L (ref 3.5–5.2)
SODIUM: 144 mmol/L (ref 134–144)

## 2018-01-14 LAB — HEPATIC FUNCTION PANEL
ALK PHOS: 49 IU/L (ref 39–117)
ALT: 29 IU/L (ref 0–44)
AST: 34 IU/L (ref 0–40)
Albumin: 4.6 g/dL (ref 3.6–4.8)
BILIRUBIN TOTAL: 0.4 mg/dL (ref 0.0–1.2)
Bilirubin, Direct: 0.14 mg/dL (ref 0.00–0.40)
Total Protein: 6.7 g/dL (ref 6.0–8.5)

## 2018-01-14 LAB — LIPID PANEL
CHOLESTEROL TOTAL: 144 mg/dL (ref 100–199)
Chol/HDL Ratio: 4.4 ratio (ref 0.0–5.0)
HDL: 33 mg/dL — ABNORMAL LOW (ref >39)
LDL Calculated: 92 mg/dL (ref 0–99)
TRIGLYCERIDES: 95 mg/dL (ref 0–149)
VLDL Cholesterol Cal: 19 mg/dL (ref 5–40)

## 2018-01-14 LAB — PSA: Prostate Specific Ag, Serum: 1.2 ng/mL (ref 0.0–4.0)

## 2018-01-19 ENCOUNTER — Encounter: Payer: Self-pay | Admitting: Family Medicine

## 2018-04-09 IMAGING — US US RENAL
1 series · 13 of 25 positions shown · non-contrast
Comparison: Renal ultrasound 11/30/2011

CLINICAL DATA: Hypertension.  Chronic kidney disease stage 3.

EXAM:
RENAL/URINARY TRACT ULTRASOUND
RENAL DUPLEX DOPPLER ULTRASOUND

[Series 1: us renal · 0.23mm/px · 13 of 90 slices shown]
[im 1/90]
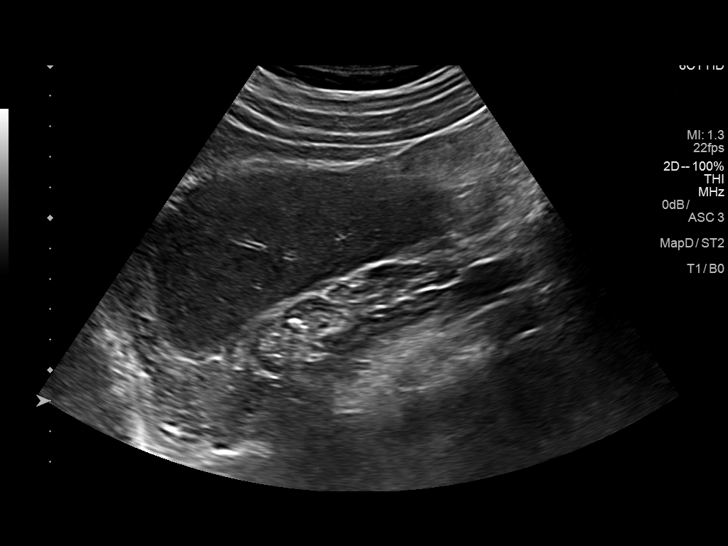
[im 8/90]
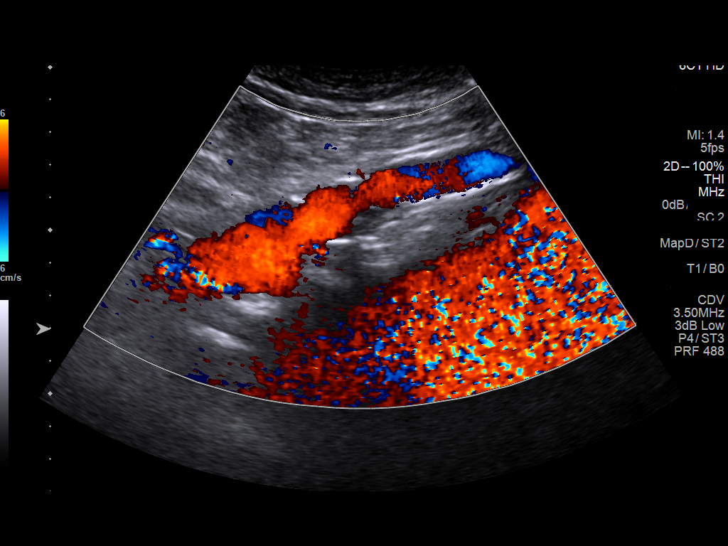
[im 15/90]
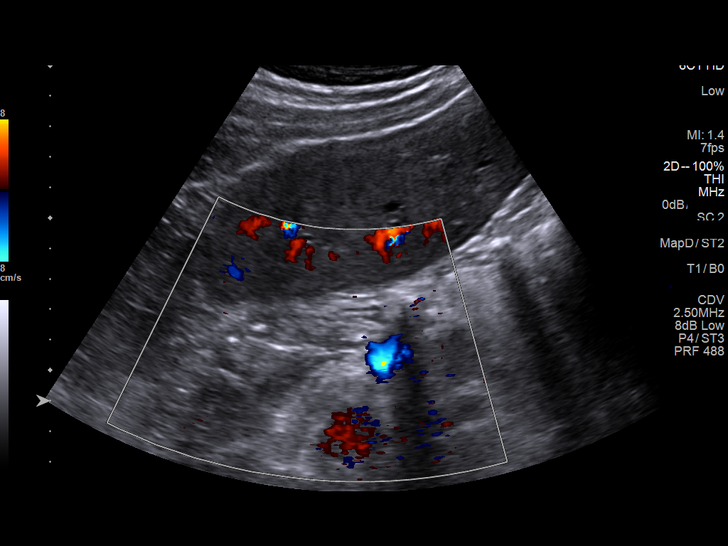
[im 23/90]
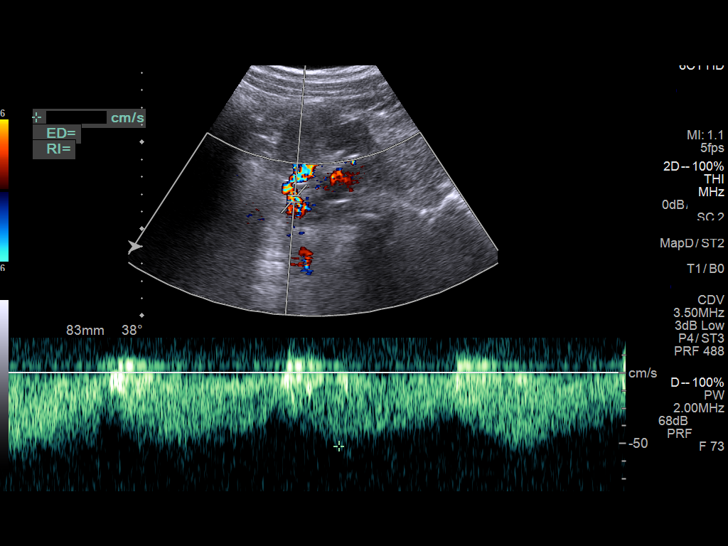
[im 30/90]
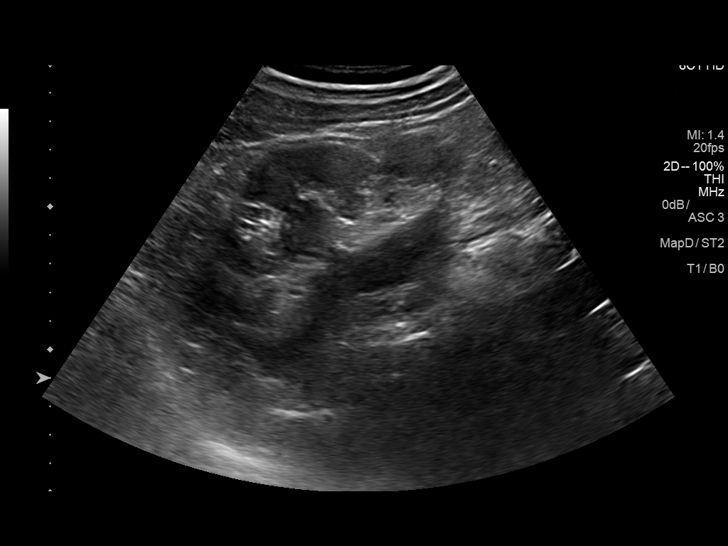
[im 38/90]
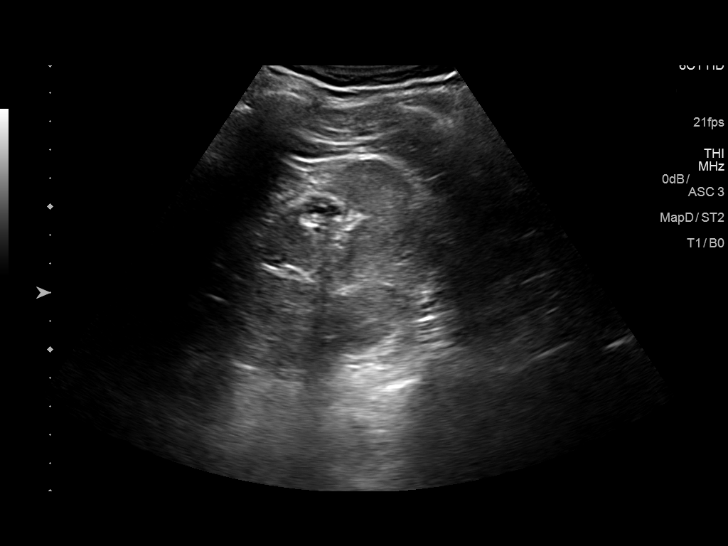
[im 45/90]
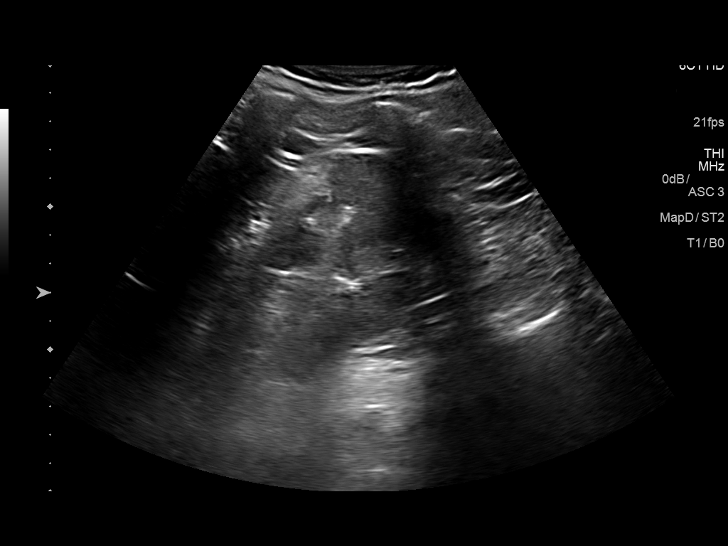
[im 52/90]
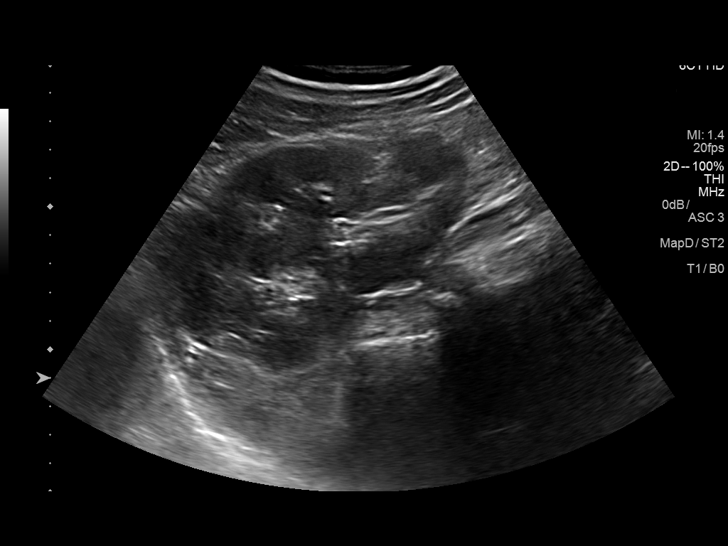
[im 60/90]
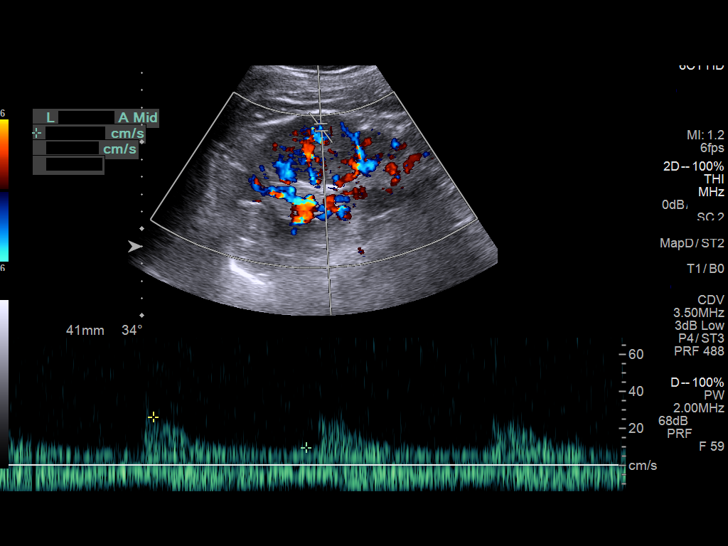
[im 67/90]
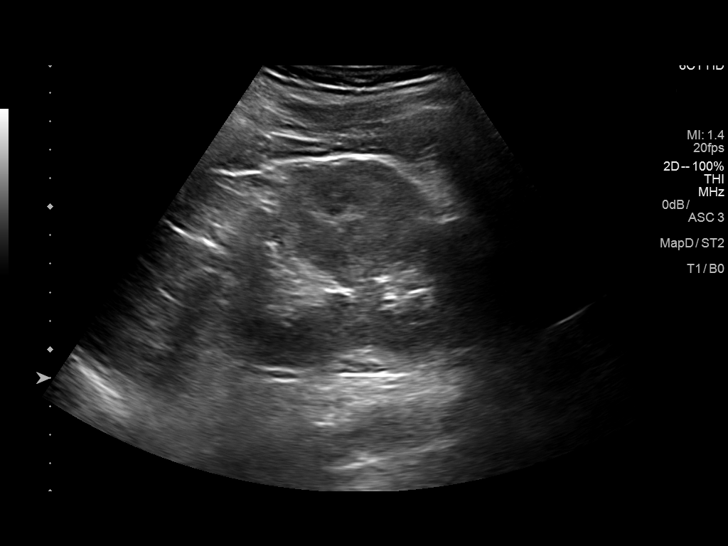
[im 75/90]
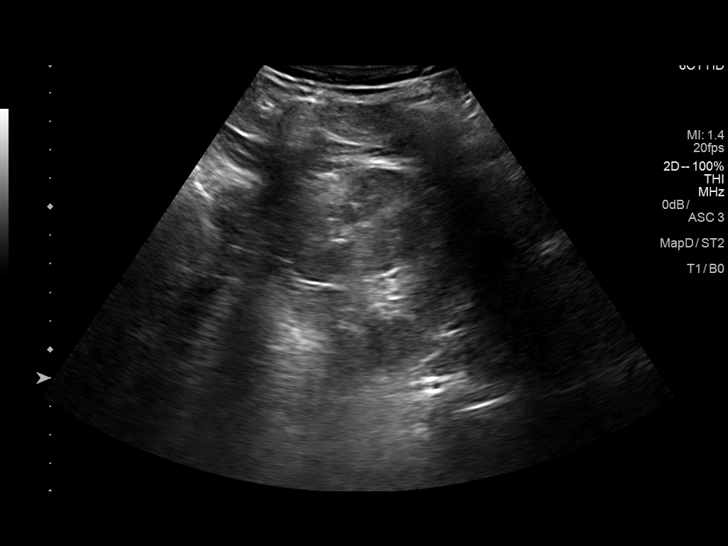
[im 82/90]
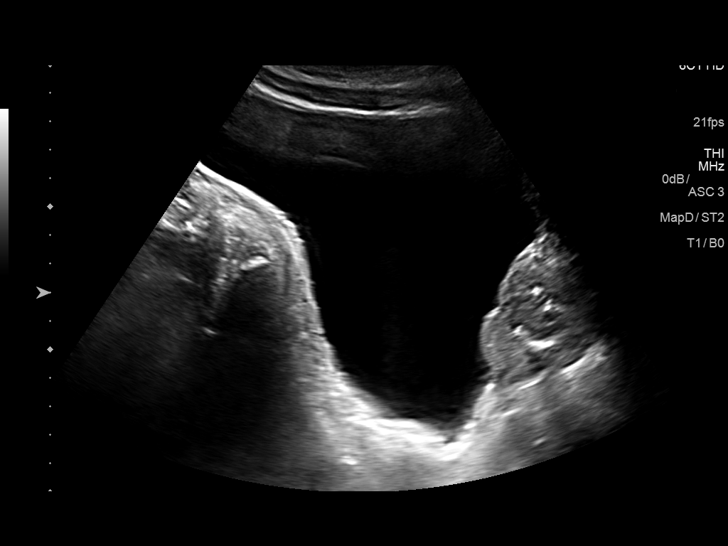
[im 90/90]
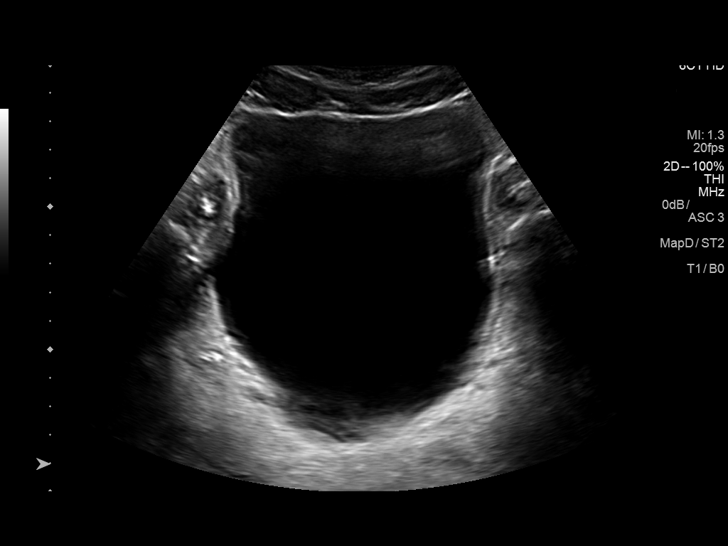

[13 of 25 positions shown; findings below may reference images not displayed]

FINDINGS: Right Kidney:

Length: 14.4 cm. There is no hydronephrosis. The contour of the
kidney is lobulated. There is a masslike appearance in the
parenchyma of the interpolar region. There are also cortical
calcifications. Ill-defined calcified mass cannot be excluded.

Left Kidney:

Length: 6.0 cm. Hyperechoic. Severely atrophic. Color Doppler
imaging demonstrates poor vascularity.

Bladder:  Within normal limits.

RENAL DUPLEX ULTRASOUND

No detected flow

Left Renal Artery Velocities:

Origin:  109 cm/sec

Mid:  107 cm/sec

Hilum:  114 cm/sec

Interlobar:  54 cm/sec

Arcuate:  26 cm/sec

Aortic Velocity:  99 Cm/sec

Right Renal-Aortic Ratios:

No detected flow

Left Renal-Aortic Ratios:

Origin:

Mid:

Hilum:

Interlobar:

Arcuate:
IMPRESSION: The right kidney is severely atrophic as was seen on the prior
study. There is no detected flow within the right renal artery. It
is probably chronically occluded with severe atrophy of the right
kidney.

There is no evidence of significant stenosis of the left renal
artery.

Abnormal appearance of the left kidney as described. Underlying
ill-defined calcified mass or malignancy cannot be excluded within
the left kidney. Consider further imaging to evaluate. The
appearance has changed since the prior study.

## 2018-06-03 DIAGNOSIS — N183 Chronic kidney disease, stage 3 (moderate): Secondary | ICD-10-CM | POA: Diagnosis not present

## 2018-06-03 DIAGNOSIS — N189 Chronic kidney disease, unspecified: Secondary | ICD-10-CM | POA: Diagnosis not present

## 2018-06-11 DIAGNOSIS — N183 Chronic kidney disease, stage 3 (moderate): Secondary | ICD-10-CM | POA: Diagnosis not present

## 2018-06-11 DIAGNOSIS — D631 Anemia in chronic kidney disease: Secondary | ICD-10-CM | POA: Diagnosis not present

## 2018-06-11 DIAGNOSIS — I129 Hypertensive chronic kidney disease with stage 1 through stage 4 chronic kidney disease, or unspecified chronic kidney disease: Secondary | ICD-10-CM | POA: Diagnosis not present

## 2018-06-11 DIAGNOSIS — N2581 Secondary hyperparathyroidism of renal origin: Secondary | ICD-10-CM | POA: Diagnosis not present

## 2018-06-19 ENCOUNTER — Ambulatory Visit (INDEPENDENT_AMBULATORY_CARE_PROVIDER_SITE_OTHER): Payer: PPO | Admitting: Family Medicine

## 2018-06-19 ENCOUNTER — Encounter: Payer: Self-pay | Admitting: Family Medicine

## 2018-06-19 VITALS — BP 138/76 | Ht 67.0 in | Wt 182.2 lb

## 2018-06-19 DIAGNOSIS — Z23 Encounter for immunization: Secondary | ICD-10-CM | POA: Diagnosis not present

## 2018-06-19 DIAGNOSIS — E119 Type 2 diabetes mellitus without complications: Secondary | ICD-10-CM | POA: Diagnosis not present

## 2018-06-19 DIAGNOSIS — E785 Hyperlipidemia, unspecified: Secondary | ICD-10-CM

## 2018-06-19 DIAGNOSIS — Z0000 Encounter for general adult medical examination without abnormal findings: Secondary | ICD-10-CM

## 2018-06-19 DIAGNOSIS — N183 Chronic kidney disease, stage 3 unspecified: Secondary | ICD-10-CM

## 2018-06-19 DIAGNOSIS — I10 Essential (primary) hypertension: Secondary | ICD-10-CM

## 2018-06-19 LAB — POCT GLYCOSYLATED HEMOGLOBIN (HGB A1C): Hemoglobin A1C: 4.8 % (ref 4.0–5.6)

## 2018-06-19 MED ORDER — AMLODIPINE BESYLATE 10 MG PO TABS: 10.0000 mg | ORAL_TABLET | Freq: Every day | ORAL | 1 refills | Status: DC

## 2018-06-19 MED ORDER — ATORVASTATIN CALCIUM 40 MG PO TABS: ORAL_TABLET | ORAL | 1 refills | Status: DC

## 2018-06-19 MED ORDER — CARVEDILOL 6.25 MG PO TABS: ORAL_TABLET | ORAL | 1 refills | Status: DC

## 2018-06-19 MED ORDER — FENOFIBRATE 160 MG PO TABS: 160.0000 mg | ORAL_TABLET | Freq: Every day | ORAL | 1 refills | Status: DC

## 2018-06-19 NOTE — Progress Notes (Signed)
Subjective:    Patient ID: Hunter AVERSA Sr., male    DOB: [birth year], 68 y.o.   MRN: [id number]  HPI The patient comes in today for a wellness visit.    A review of their health history was completed.  A review of medications was also completed.  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   Additional concerns: none Results for orders placed or performed in visit on 06/19/18  POCT HgB A1C  Result Value Ref Range   Hemoglobin A1C 4.8 4.0 - 5.6 %   HbA1c POC (<> result, manual entry)     HbA1c, POC (prediabetic range)     HbA1c, POC (controlled diabetic range)     Blood pressure medicine and blood pressure levels reviewed today with patient. Compliant with blood pressure medicine. States does not miss a dose. No obvious side effects. Blood pressure generally good when checked elsewhere. Watching salt intake.   Patient claims compliance with diabetes medication. No obvious side effects. Reports no substantial low sugar spells. Most numbers are generally in good range when checked fasting. Generally does not miss a dose of medication. Watching diabetic diet closely  Patient continues to take lipid medication regularly. No obvious side effects from it. Generally does not miss a dose. Prior blood work results are reviewed with patient. Patient continues to work on fat intake in diet    nuers generally good when cked elsewhere     Review of Systems  Constitutional: Negative for activity change, appetite change and fever.  HENT: Negative for congestion and rhinorrhea.   Eyes: Negative for discharge.  Respiratory: Negative for cough and wheezing.   Cardiovascular: Negative for chest pain.  Gastrointestinal: Negative for abdominal pain, blood in stool and  vomiting.  Genitourinary: Negative for difficulty urinating and frequency.  Musculoskeletal: Negative for neck pain.  Skin: Negative for rash.  Allergic/Immunologic: Negative for environmental allergies and food allergies.  Neurological: Negative for weakness and headaches.  Psychiatric/Behavioral: Negative for agitation.  All other systems reviewed and are negative.      Objective:   Physical Exam Vitals signs reviewed.  Constitutional:      Appearance: He is well-developed.  HENT:     Head: Normocephalic and atraumatic.     Right Ear: External ear normal.     Left Ear: External ear normal.     Nose: Nose normal.  Eyes:     Pupils: Pupils are equal, round, and reactive to light.  Neck:     Musculoskeletal: Normal range of motion and neck supple.     Thyroid: No thyromegaly.  Cardiovascular:     Rate and Rhythm: Normal rate and regular rhythm.     Heart sounds: Normal heart sounds. No murmur.  Pulmonary:     Effort: Pulmonary effort is normal. No respiratory distress.     Breath sounds: Normal breath sounds. No wheezing.  Abdominal:     General: Bowel sounds are normal. There is no distension.     Palpations: Abdomen is soft. There is no mass.     Tenderness: There is no abdominal tenderness.  Genitourinary:    Penis: Normal.   Musculoskeletal: Normal range of motion.  Lymphadenopathy:     Cervical: No cervical adenopathy.  Skin:    General: Skin is  warm and dry.     Findings: No erythema.  Neurological:     Mental Status: He is alert.     Motor: No abnormal muscle tone.  Psychiatric:        Behavior: Behavior normal.        Judgment: Judgment normal.           Assessment & Plan:  Impression wellness exam.  Diet discussed.  Exercise discussed.  Patient declines vaccinations.  Colonoscopy utd last 1 done 3-1/2 years ago  2.  Hypertension good control discussed maintain same approach increasing dose of meds for meds check  3.  Hyperlipidemia status  uncertain discussed./We will check appropriate blood work medications refilled  4.  Type 2 diabetes also uncontrolled.  On no medications  5.  Chronic renal disease followed by specialist

## 2018-06-30 DIAGNOSIS — E785 Hyperlipidemia, unspecified: Secondary | ICD-10-CM | POA: Diagnosis not present

## 2018-06-30 DIAGNOSIS — I10 Essential (primary) hypertension: Secondary | ICD-10-CM | POA: Diagnosis not present

## 2018-07-01 LAB — HEPATIC FUNCTION PANEL
ALBUMIN: 4.6 g/dL (ref 3.8–4.8)
ALT: 21 IU/L (ref 0–44)
AST: 28 IU/L (ref 0–40)
Alkaline Phosphatase: 53 IU/L (ref 39–117)
Bilirubin Total: 0.4 mg/dL (ref 0.0–1.2)
Bilirubin, Direct: 0.13 mg/dL (ref 0.00–0.40)
TOTAL PROTEIN: 6.8 g/dL (ref 6.0–8.5)

## 2018-07-01 LAB — LIPID PANEL
Chol/HDL Ratio: 4.1 ratio (ref 0.0–5.0)
Cholesterol, Total: 140 mg/dL (ref 100–199)
HDL: 34 mg/dL — ABNORMAL LOW (ref >39)
LDL Calculated: 88 mg/dL (ref 0–99)
Triglycerides: 89 mg/dL (ref 0–149)
VLDL Cholesterol Cal: 18 mg/dL (ref 5–40)

## 2018-07-06 ENCOUNTER — Encounter: Payer: Self-pay | Admitting: Family Medicine

## 2018-12-05 DIAGNOSIS — N183 Chronic kidney disease, stage 3 (moderate): Secondary | ICD-10-CM | POA: Diagnosis not present

## 2018-12-09 DIAGNOSIS — D631 Anemia in chronic kidney disease: Secondary | ICD-10-CM | POA: Diagnosis not present

## 2018-12-09 DIAGNOSIS — N2581 Secondary hyperparathyroidism of renal origin: Secondary | ICD-10-CM | POA: Diagnosis not present

## 2018-12-09 DIAGNOSIS — N183 Chronic kidney disease, stage 3 (moderate): Secondary | ICD-10-CM | POA: Diagnosis not present

## 2018-12-09 DIAGNOSIS — I129 Hypertensive chronic kidney disease with stage 1 through stage 4 chronic kidney disease, or unspecified chronic kidney disease: Secondary | ICD-10-CM | POA: Diagnosis not present

## 2018-12-23 ENCOUNTER — Other Ambulatory Visit: Payer: Self-pay

## 2018-12-23 ENCOUNTER — Encounter: Payer: Self-pay | Admitting: Family Medicine

## 2018-12-23 ENCOUNTER — Ambulatory Visit (INDEPENDENT_AMBULATORY_CARE_PROVIDER_SITE_OTHER): Payer: PPO | Admitting: Family Medicine

## 2018-12-23 DIAGNOSIS — Z125 Encounter for screening for malignant neoplasm of prostate: Secondary | ICD-10-CM

## 2018-12-23 DIAGNOSIS — E785 Hyperlipidemia, unspecified: Secondary | ICD-10-CM | POA: Diagnosis not present

## 2018-12-23 DIAGNOSIS — I10 Essential (primary) hypertension: Secondary | ICD-10-CM | POA: Diagnosis not present

## 2018-12-23 DIAGNOSIS — E119 Type 2 diabetes mellitus without complications: Secondary | ICD-10-CM | POA: Diagnosis not present

## 2018-12-23 MED ORDER — ATORVASTATIN CALCIUM 40 MG PO TABS: ORAL_TABLET | ORAL | 1 refills | Status: DC

## 2018-12-23 MED ORDER — CARVEDILOL 6.25 MG PO TABS: ORAL_TABLET | ORAL | 1 refills | Status: DC

## 2018-12-23 MED ORDER — FENOFIBRATE 160 MG PO TABS: 160.0000 mg | ORAL_TABLET | Freq: Every day | ORAL | 1 refills | Status: DC

## 2018-12-23 MED ORDER — AMLODIPINE BESYLATE 10 MG PO TABS: 10.0000 mg | ORAL_TABLET | Freq: Every day | ORAL | 1 refills | Status: DC

## 2018-12-23 NOTE — Progress Notes (Signed)
   Subjective:    Patient ID: Hunter SORDEN Sr., male    DOB: 1951-01-12, 68 y.o.   MRN: [id number]  Hypertension This is a chronic problem. The current episode started more than 1 year ago. Risk factors for coronary artery disease include dyslipidemia and male gender. Treatments tried: norvasc. There are no compliance problems.      Reports no substantial low sugar spells. Most numbers are generally in good range when checked fasting. Generally does   Watching diabetic diet closely  Patient reports glucoses generally 1 10-1 20.  Review of Systems Virtual Visit via Video Note  I connected with Hunter MORIARITY Sr. on 12/23/18 at  2:00 PM EDT by a video enabled telemedicine application and verified that I am speaking with the correct person using two identifiers.  Location: Patient: home Provider: office   I discussed the limitations of evaluation and management by telemedicine and the availability of in person appointments. The patient expressed understanding and agreed to proceed.  History of Present Illness:    Observations/Objective:   Assessment and Plan:   Follow Up Instructions:    I discussed the assessment and treatment plan with the patient. The patient was provided an opportunity to ask questions and all were answered. The patient agreed with the plan and demonstrated an understanding of the instructions.   The patient was advised to call back or seek an in-person evaluation if the symptoms worsen or if the condition fails to improve as anticipated.  I provided 20 minutes of non-face-to-face time during this encounter.   Blood pressure medicine and blood pressure levels reviewed today with patient. Compliant with blood pressure medicine. States does not miss a dose. No obvious side effects. Blood pressure generally good when checked elsewhere. Watching salt intake.    Patient continues to take lipid medication regularly. No obvious side effects from it. Generally does  not miss a dose. Prior blood work results are reviewed with patient. Patient continues to work on fat intake in diet  Exercising very regularly  No headache, no major weight loss or weight gain, no chest pain no back pain abdominal pain no change in bowel habits complete ROS otherwise negative     Objective:   Physical Exam Virtual       Assessment & Plan:  Impression 1 hyperlipidemia.  Current status uncertain.  Appropriate blood work.  Compliance discussed diet discussed  2.  Hypertension.  Apparent good control.  Medications refilled.  Salt intake discussed importance of exercise discussed  3.  Type 2 diabetes.  Controlled by diet and exercise.  Await A1c  4.  Coronavirus concerns discussed  Greater than 50% of this 25 minute none face to face visit was spent in counseling and discussion and coordination of care regarding the above diagnosis/diagnosies  Of note patient followed by nephrologist for chronic renal disease  Await blood work results

## 2019-01-05 DIAGNOSIS — I10 Essential (primary) hypertension: Secondary | ICD-10-CM | POA: Diagnosis not present

## 2019-01-05 DIAGNOSIS — E785 Hyperlipidemia, unspecified: Secondary | ICD-10-CM | POA: Diagnosis not present

## 2019-01-05 DIAGNOSIS — Z125 Encounter for screening for malignant neoplasm of prostate: Secondary | ICD-10-CM | POA: Diagnosis not present

## 2019-01-05 DIAGNOSIS — E119 Type 2 diabetes mellitus without complications: Secondary | ICD-10-CM | POA: Diagnosis not present

## 2019-01-06 LAB — BASIC METABOLIC PANEL
BUN/Creatinine Ratio: 13 (ref 10–24)
BUN: 22 mg/dL (ref 8–27)
CO2: 21 mmol/L (ref 20–29)
Calcium: 9.4 mg/dL (ref 8.6–10.2)
Chloride: 105 mmol/L (ref 96–106)
Creatinine, Ser: 1.65 mg/dL — ABNORMAL HIGH (ref 0.76–1.27)
GFR calc Af Amer: 49 mL/min/{1.73_m2} — ABNORMAL LOW (ref >59)
GFR calc non Af Amer: 42 mL/min/{1.73_m2} — ABNORMAL LOW (ref >59)
Glucose: 136 mg/dL — ABNORMAL HIGH (ref 65–99)
Potassium: 4.5 mmol/L (ref 3.5–5.2)
Sodium: 140 mmol/L (ref 134–144)

## 2019-01-06 LAB — LIPID PANEL
Chol/HDL Ratio: 4.8 ratio (ref 0.0–5.0)
Cholesterol, Total: 152 mg/dL (ref 100–199)
HDL: 32 mg/dL — ABNORMAL LOW (ref >39)
LDL Chol Calc (NIH): 97 mg/dL (ref 0–99)
Triglycerides: 127 mg/dL (ref 0–149)
VLDL Cholesterol Cal: 23 mg/dL (ref 5–40)

## 2019-01-06 LAB — HEPATIC FUNCTION PANEL
ALT: 21 IU/L (ref 0–44)
AST: 30 IU/L (ref 0–40)
Albumin: 4.4 g/dL (ref 3.8–4.8)
Alkaline Phosphatase: 61 IU/L (ref 39–117)
Bilirubin Total: 0.4 mg/dL (ref 0.0–1.2)
Bilirubin, Direct: 0.12 mg/dL (ref 0.00–0.40)
Total Protein: 6.9 g/dL (ref 6.0–8.5)

## 2019-01-06 LAB — PSA: Prostate Specific Ag, Serum: 1.1 ng/mL (ref 0.0–4.0)

## 2019-01-06 LAB — HEMOGLOBIN A1C
Est. average glucose Bld gHb Est-mCnc: 123 mg/dL
Hgb A1c MFr Bld: 5.9 % — ABNORMAL HIGH (ref 4.8–5.6)

## 2019-01-15 ENCOUNTER — Encounter: Payer: Self-pay | Admitting: Family Medicine

## 2019-03-11 ENCOUNTER — Other Ambulatory Visit: Payer: Self-pay

## 2019-04-02 DIAGNOSIS — H524 Presbyopia: Secondary | ICD-10-CM | POA: Diagnosis not present

## 2019-04-02 LAB — HM DIABETES EYE EXAM

## 2019-04-06 DIAGNOSIS — U071 COVID-19: Secondary | ICD-10-CM | POA: Diagnosis not present

## 2019-04-06 DIAGNOSIS — Z20828 Contact with and (suspected) exposure to other viral communicable diseases: Secondary | ICD-10-CM | POA: Diagnosis not present

## 2019-04-13 DIAGNOSIS — U071 COVID-19: Secondary | ICD-10-CM | POA: Diagnosis not present

## 2019-04-13 DIAGNOSIS — Z20828 Contact with and (suspected) exposure to other viral communicable diseases: Secondary | ICD-10-CM | POA: Diagnosis not present

## 2019-04-20 DIAGNOSIS — Z20828 Contact with and (suspected) exposure to other viral communicable diseases: Secondary | ICD-10-CM | POA: Diagnosis not present

## 2019-04-20 DIAGNOSIS — U071 COVID-19: Secondary | ICD-10-CM | POA: Diagnosis not present

## 2019-04-27 DIAGNOSIS — Z20828 Contact with and (suspected) exposure to other viral communicable diseases: Secondary | ICD-10-CM | POA: Diagnosis not present

## 2019-04-27 DIAGNOSIS — U071 COVID-19: Secondary | ICD-10-CM | POA: Diagnosis not present

## 2019-05-04 DIAGNOSIS — U071 COVID-19: Secondary | ICD-10-CM | POA: Diagnosis not present

## 2019-05-04 DIAGNOSIS — Z20828 Contact with and (suspected) exposure to other viral communicable diseases: Secondary | ICD-10-CM | POA: Diagnosis not present

## 2019-05-11 DIAGNOSIS — U071 COVID-19: Secondary | ICD-10-CM | POA: Diagnosis not present

## 2019-05-11 DIAGNOSIS — Z20828 Contact with and (suspected) exposure to other viral communicable diseases: Secondary | ICD-10-CM | POA: Diagnosis not present

## 2019-05-18 DIAGNOSIS — U071 COVID-19: Secondary | ICD-10-CM | POA: Diagnosis not present

## 2019-05-18 DIAGNOSIS — Z20828 Contact with and (suspected) exposure to other viral communicable diseases: Secondary | ICD-10-CM | POA: Diagnosis not present

## 2019-05-21 ENCOUNTER — Encounter: Payer: Self-pay | Admitting: Family Medicine

## 2019-05-25 DIAGNOSIS — Z20828 Contact with and (suspected) exposure to other viral communicable diseases: Secondary | ICD-10-CM | POA: Diagnosis not present

## 2019-05-25 DIAGNOSIS — U071 COVID-19: Secondary | ICD-10-CM | POA: Diagnosis not present

## 2019-06-02 DIAGNOSIS — U071 COVID-19: Secondary | ICD-10-CM | POA: Diagnosis not present

## 2019-06-08 DIAGNOSIS — Z20828 Contact with and (suspected) exposure to other viral communicable diseases: Secondary | ICD-10-CM | POA: Diagnosis not present

## 2019-06-08 DIAGNOSIS — U071 COVID-19: Secondary | ICD-10-CM | POA: Diagnosis not present

## 2019-06-15 DIAGNOSIS — U071 COVID-19: Secondary | ICD-10-CM | POA: Diagnosis not present

## 2019-06-17 DIAGNOSIS — N183 Chronic kidney disease, stage 3 unspecified: Secondary | ICD-10-CM | POA: Diagnosis not present

## 2019-06-22 DIAGNOSIS — U071 COVID-19: Secondary | ICD-10-CM | POA: Diagnosis not present

## 2019-06-23 ENCOUNTER — Other Ambulatory Visit: Payer: Self-pay | Admitting: Family Medicine

## 2019-06-24 DIAGNOSIS — N183 Chronic kidney disease, stage 3 unspecified: Secondary | ICD-10-CM | POA: Diagnosis not present

## 2019-06-24 DIAGNOSIS — N2581 Secondary hyperparathyroidism of renal origin: Secondary | ICD-10-CM | POA: Diagnosis not present

## 2019-06-24 DIAGNOSIS — D631 Anemia in chronic kidney disease: Secondary | ICD-10-CM | POA: Diagnosis not present

## 2019-06-24 DIAGNOSIS — I129 Hypertensive chronic kidney disease with stage 1 through stage 4 chronic kidney disease, or unspecified chronic kidney disease: Secondary | ICD-10-CM | POA: Diagnosis not present

## 2019-06-29 DIAGNOSIS — U071 COVID-19: Secondary | ICD-10-CM | POA: Diagnosis not present

## 2019-06-29 DIAGNOSIS — Z20828 Contact with and (suspected) exposure to other viral communicable diseases: Secondary | ICD-10-CM | POA: Diagnosis not present

## 2019-07-06 DIAGNOSIS — Z20828 Contact with and (suspected) exposure to other viral communicable diseases: Secondary | ICD-10-CM | POA: Diagnosis not present

## 2019-07-06 DIAGNOSIS — U071 COVID-19: Secondary | ICD-10-CM | POA: Diagnosis not present

## 2019-07-14 DIAGNOSIS — U071 COVID-19: Secondary | ICD-10-CM | POA: Diagnosis not present

## 2019-07-14 DIAGNOSIS — Z20828 Contact with and (suspected) exposure to other viral communicable diseases: Secondary | ICD-10-CM | POA: Diagnosis not present

## 2019-07-20 DIAGNOSIS — U071 COVID-19: Secondary | ICD-10-CM | POA: Diagnosis not present

## 2019-07-20 DIAGNOSIS — Z20828 Contact with and (suspected) exposure to other viral communicable diseases: Secondary | ICD-10-CM | POA: Diagnosis not present

## 2019-07-27 DIAGNOSIS — Z20828 Contact with and (suspected) exposure to other viral communicable diseases: Secondary | ICD-10-CM | POA: Diagnosis not present

## 2019-07-27 DIAGNOSIS — U071 COVID-19: Secondary | ICD-10-CM | POA: Diagnosis not present

## 2019-09-26 ENCOUNTER — Other Ambulatory Visit: Payer: Self-pay | Admitting: Family Medicine

## 2019-09-26 DIAGNOSIS — E119 Type 2 diabetes mellitus without complications: Secondary | ICD-10-CM

## 2019-09-26 DIAGNOSIS — Z79899 Other long term (current) drug therapy: Secondary | ICD-10-CM

## 2019-09-26 DIAGNOSIS — Z125 Encounter for screening for malignant neoplasm of prostate: Secondary | ICD-10-CM

## 2019-09-26 DIAGNOSIS — I10 Essential (primary) hypertension: Secondary | ICD-10-CM

## 2019-09-26 DIAGNOSIS — E785 Hyperlipidemia, unspecified: Secondary | ICD-10-CM

## 2019-09-28 ENCOUNTER — Other Ambulatory Visit: Payer: Self-pay | Admitting: Family Medicine

## 2019-09-28 NOTE — Telephone Encounter (Signed)
Last med check up 12/23/18. Please schedule appt and then route back to get dr taylor to review

## 2019-09-28 NOTE — Telephone Encounter (Signed)
Scheduled 6/30

## 2019-09-28 NOTE — Telephone Encounter (Signed)
Last med check up 12/23/18 and has upcoming appt 6/30

## 2019-09-28 NOTE — Telephone Encounter (Signed)
lvm to schedule appt.  

## 2019-10-09 ENCOUNTER — Other Ambulatory Visit: Payer: Self-pay | Admitting: Family Medicine

## 2019-10-14 ENCOUNTER — Encounter: Payer: Self-pay | Admitting: Family Medicine

## 2019-10-14 ENCOUNTER — Other Ambulatory Visit: Payer: Self-pay

## 2019-10-14 ENCOUNTER — Ambulatory Visit (INDEPENDENT_AMBULATORY_CARE_PROVIDER_SITE_OTHER): Payer: PPO | Admitting: Family Medicine

## 2019-10-14 VITALS — BP 132/84 | HR 69 | Temp 97.7°F | Ht 67.0 in | Wt 177.6 lb

## 2019-10-14 DIAGNOSIS — I10 Essential (primary) hypertension: Secondary | ICD-10-CM

## 2019-10-14 DIAGNOSIS — N183 Chronic kidney disease, stage 3 unspecified: Secondary | ICD-10-CM

## 2019-10-14 DIAGNOSIS — E785 Hyperlipidemia, unspecified: Secondary | ICD-10-CM

## 2019-10-14 DIAGNOSIS — N2581 Secondary hyperparathyroidism of renal origin: Secondary | ICD-10-CM | POA: Diagnosis not present

## 2019-10-14 DIAGNOSIS — E119 Type 2 diabetes mellitus without complications: Secondary | ICD-10-CM

## 2019-10-14 MED ORDER — FENOFIBRATE 160 MG PO TABS: 160.0000 mg | ORAL_TABLET | Freq: Every day | ORAL | 1 refills | Status: DC

## 2019-10-14 NOTE — Progress Notes (Signed)
Patient ID: Hunter WALKOWSKI Sr., male    DOB: 02-08-1951, 69 y.o.   MRN: [id number]   Chief Complaint  Patient presents with  . Follow-up    Pt here for follow up. Pt taking all meds as prescribed. No issues or concerns at this time    Subjective:    HPI  Dm2- Compliant with medications. Checking blood glucose daily.  Not seeing any high or low numbers.  Denies polyuria or polydipsia.  Eye exam: don 12/20, negative for retinopathy. On diet modification. Pt had slightly elevated glucose.  Checking BG in morning seeing 130s.  H/o CKD- Has ckd stage 3, seeing nephrologist.  HTN Pt compliant with BP meds.  No SEs Denies chest pain, sob, LE swelling, or blurry vision. BP ranges at home 130s.   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  Eye exam for DM is negative in 12/20. Went to diabetic educator already.  Doing diet modifications.  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   Seeing Dr. Posey Pronto, nephrology. Seen by urology in past.   Secondary hyperparathyroidism of renal origin.  Has wife with h/o breast cancer. Pt had cholecystectomy in 2016.  Pt was very sick with cholecystitis and required surgery.   Medical History Kenichi has a past medical history of Gout, Hypercholesteremia, Hypertension, Impaired glucose metabolism, PUD (peptic ulcer disease), Reflux, and Renal disorder.   Outpatient Encounter Medications as of 10/14/2019  Medication Sig  . amLODipine (NORVASC) 10 MG tablet Take 1 tablet by mouth once daily  . aspirin 81 MG tablet Take 81 mg by mouth daily.  Marland Kitchen atorvastatin (LIPITOR) 40 MG tablet TAKE 1 TABLET BY MOUTH ONCE DAILY. DISCONTINUE  PRAVASTATIN.  . carvedilol (COREG) 6.25 MG tablet TAKE 1 TABLET BY MOUTH TWICE DAILY WITH A MEAL  . diphenhydrAMINE (BENADRYL) 25 MG tablet Take 25-50 mg by mouth at bedtime as  needed for sleep.   . fenofibrate 160 MG tablet Take 1 tablet (160 mg total) by mouth at bedtime.  Marland Kitchen omeprazole (PRILOSEC) 20 MG capsule Take 20 mg by mouth daily as needed (for acid reflux).  . [DISCONTINUED] fenofibrate 160 MG tablet Take 1 tablet (160 mg total) by mouth at bedtime.   No facility-administered encounter medications on file as of 10/14/2019.     Review of Systems  Constitutional: Negative for chills and fever.  HENT: Negative for congestion, rhinorrhea and sore throat.   Respiratory: Negative for cough, shortness of breath and wheezing.   Cardiovascular: Negative for chest pain and leg swelling.  Gastrointestinal: Negative for abdominal pain, diarrhea, nausea and vomiting.  Genitourinary: Negative for dysuria and frequency.  Skin: Negative for rash.  Neurological: Negative for dizziness, weakness and headaches.     Vitals BP 132/84   Pulse 69   Temp 97.7 F (36.5 C)   Ht _0  (1.702 m)   Wt 177 lb 9.6 oz (80.6 kg)   SpO2 98%   BMI 27.82 kg/m   Objective:   Physical Exam Vitals and nursing note reviewed.  Constitutional:      General: He is not in acute distress.    Appearance: Normal appearance. He is not ill-appearing.  HENT:     Head: Normocephalic.  Eyes:     Extraocular Movements: Extraocular movements intact.     Conjunctiva/sclera: Conjunctivae normal.     Pupils: Pupils are equal,  round, and reactive to light.  Cardiovascular:     Rate and Rhythm: Normal rate and regular rhythm.     Pulses: Normal pulses.     Heart sounds: Murmur (systolic) heard.   Pulmonary:     Effort: Pulmonary effort is normal. No respiratory distress.     Breath sounds: Normal breath sounds. No wheezing, rhonchi or rales.  Musculoskeletal:        General: Normal range of motion.     Right lower leg: No edema.     Left lower leg: No edema.  Skin:    General: Skin is warm and dry.     Findings: No rash.  Neurological:     General: No focal deficit present.      Mental Status: He is alert and oriented to person, place, and time.     Cranial Nerves: No cranial nerve deficit.  Psychiatric:        Mood and Affect: Mood normal.        Behavior: Behavior normal.        Thought Content: Thought content normal.        Judgment: Judgment normal.      Assessment and Plan   1. Essential hypertension - CBC - CMP14+EGFR - Lipid panel  2. Hyperlipidemia LDL goal <100 - Lipid panel  3. Chronic renal failure, stage 3 (moderate), unspecified whether stage 3a or 3b CKD  4. Secondary hyperparathyroidism of renal origin ([place])  5. Type 2 diabetes mellitus without complication, without long-term current use of insulin (HCC) - Hemoglobin A1c   HTN- stable, cont meds. HLD- stable, cont meds.  DM2- A1c 5.9. on last check.  Will recheck labs and adjust if necessary. Had been doing diet modification. 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 CKD stage 3- cont with f/u with nephrology.  F/u [address] prn. Pt to get labs, will call with results.

## 2019-10-23 DIAGNOSIS — I10 Essential (primary) hypertension: Secondary | ICD-10-CM | POA: Diagnosis not present

## 2019-10-23 DIAGNOSIS — E785 Hyperlipidemia, unspecified: Secondary | ICD-10-CM | POA: Diagnosis not present

## 2019-10-23 DIAGNOSIS — R7309 Other abnormal glucose: Secondary | ICD-10-CM | POA: Diagnosis not present

## 2019-10-24 LAB — CMP14+EGFR
ALT: 22 IU/L (ref 0–44)
AST: 33 IU/L (ref 0–40)
Albumin/Globulin Ratio: 1.8 (ref 1.2–2.2)
Albumin: 4.7 g/dL (ref 3.8–4.8)
Alkaline Phosphatase: 59 IU/L (ref 48–121)
BUN/Creatinine Ratio: 14 (ref 10–24)
BUN: 25 mg/dL (ref 8–27)
Bilirubin Total: 0.5 mg/dL (ref 0.0–1.2)
CO2: 21 mmol/L (ref 20–29)
Calcium: 9.4 mg/dL (ref 8.6–10.2)
Chloride: 106 mmol/L (ref 96–106)
Creatinine, Ser: 1.85 mg/dL — ABNORMAL HIGH (ref 0.76–1.27)
GFR calc Af Amer: 42 mL/min/{1.73_m2} — ABNORMAL LOW (ref >59)
GFR calc non Af Amer: 37 mL/min/{1.73_m2} — ABNORMAL LOW (ref >59)
Globulin, Total: 2.6 g/dL (ref 1.5–4.5)
Glucose: 148 mg/dL — ABNORMAL HIGH (ref 65–99)
Potassium: 4.5 mmol/L (ref 3.5–5.2)
Sodium: 141 mmol/L (ref 134–144)
Total Protein: 7.3 g/dL (ref 6.0–8.5)

## 2019-10-24 LAB — CBC
Hematocrit: 42.2 % (ref 37.5–51.0)
Hemoglobin: 14 g/dL (ref 13.0–17.7)
MCH: 29.3 pg (ref 26.6–33.0)
MCHC: 33.2 g/dL (ref 31.5–35.7)
MCV: 88 fL (ref 79–97)
Platelets: 197 10*3/uL (ref 150–450)
RBC: 4.78 x10E6/uL (ref 4.14–5.80)
RDW: 12.8 % (ref 11.6–15.4)
WBC: 6.2 10*3/uL (ref 3.4–10.8)

## 2019-10-24 LAB — HEMOGLOBIN A1C
Est. average glucose Bld gHb Est-mCnc: 131 mg/dL
Hgb A1c MFr Bld: 6.2 % — ABNORMAL HIGH (ref 4.8–5.6)

## 2019-10-24 LAB — LIPID PANEL
Chol/HDL Ratio: 3.8 ratio (ref 0.0–5.0)
Cholesterol, Total: 138 mg/dL (ref 100–199)
HDL: 36 mg/dL — ABNORMAL LOW (ref >39)
LDL Chol Calc (NIH): 84 mg/dL (ref 0–99)
Triglycerides: 94 mg/dL (ref 0–149)
VLDL Cholesterol Cal: 18 mg/dL (ref 5–40)

## 2019-12-01 DIAGNOSIS — Z20828 Contact with and (suspected) exposure to other viral communicable diseases: Secondary | ICD-10-CM | POA: Diagnosis not present

## 2019-12-01 DIAGNOSIS — U071 COVID-19: Secondary | ICD-10-CM | POA: Diagnosis not present

## 2019-12-15 DIAGNOSIS — U071 COVID-19: Secondary | ICD-10-CM | POA: Diagnosis not present

## 2019-12-15 DIAGNOSIS — Z20828 Contact with and (suspected) exposure to other viral communicable diseases: Secondary | ICD-10-CM | POA: Diagnosis not present

## 2019-12-23 DIAGNOSIS — U071 COVID-19: Secondary | ICD-10-CM | POA: Diagnosis not present

## 2019-12-23 DIAGNOSIS — Z20828 Contact with and (suspected) exposure to other viral communicable diseases: Secondary | ICD-10-CM | POA: Diagnosis not present

## 2019-12-24 DIAGNOSIS — Z20828 Contact with and (suspected) exposure to other viral communicable diseases: Secondary | ICD-10-CM | POA: Diagnosis not present

## 2019-12-24 DIAGNOSIS — U071 COVID-19: Secondary | ICD-10-CM | POA: Diagnosis not present

## 2019-12-28 ENCOUNTER — Other Ambulatory Visit: Payer: Self-pay | Admitting: Family Medicine

## 2019-12-28 DIAGNOSIS — U071 COVID-19: Secondary | ICD-10-CM | POA: Diagnosis not present

## 2019-12-28 DIAGNOSIS — Z20828 Contact with and (suspected) exposure to other viral communicable diseases: Secondary | ICD-10-CM | POA: Diagnosis not present

## 2019-12-31 DIAGNOSIS — U071 COVID-19: Secondary | ICD-10-CM | POA: Diagnosis not present

## 2019-12-31 DIAGNOSIS — Z20828 Contact with and (suspected) exposure to other viral communicable diseases: Secondary | ICD-10-CM | POA: Diagnosis not present

## 2020-01-04 ENCOUNTER — Other Ambulatory Visit: Payer: Self-pay | Admitting: Family Medicine

## 2020-01-05 DIAGNOSIS — Z20828 Contact with and (suspected) exposure to other viral communicable diseases: Secondary | ICD-10-CM | POA: Diagnosis not present

## 2020-01-05 DIAGNOSIS — U071 COVID-19: Secondary | ICD-10-CM | POA: Diagnosis not present

## 2020-01-09 ENCOUNTER — Other Ambulatory Visit: Payer: Self-pay | Admitting: Family Medicine

## 2020-01-14 DIAGNOSIS — U071 COVID-19: Secondary | ICD-10-CM | POA: Diagnosis not present

## 2020-01-14 DIAGNOSIS — Z20828 Contact with and (suspected) exposure to other viral communicable diseases: Secondary | ICD-10-CM | POA: Diagnosis not present

## 2020-01-19 DIAGNOSIS — U071 COVID-19: Secondary | ICD-10-CM | POA: Diagnosis not present

## 2020-01-19 DIAGNOSIS — Z20828 Contact with and (suspected) exposure to other viral communicable diseases: Secondary | ICD-10-CM | POA: Diagnosis not present

## 2020-01-21 DIAGNOSIS — U071 COVID-19: Secondary | ICD-10-CM | POA: Diagnosis not present

## 2020-01-21 DIAGNOSIS — Z20828 Contact with and (suspected) exposure to other viral communicable diseases: Secondary | ICD-10-CM | POA: Diagnosis not present

## 2020-01-26 DIAGNOSIS — Z20828 Contact with and (suspected) exposure to other viral communicable diseases: Secondary | ICD-10-CM | POA: Diagnosis not present

## 2020-01-26 DIAGNOSIS — U071 COVID-19: Secondary | ICD-10-CM | POA: Diagnosis not present

## 2020-01-28 DIAGNOSIS — Z20828 Contact with and (suspected) exposure to other viral communicable diseases: Secondary | ICD-10-CM | POA: Diagnosis not present

## 2020-01-28 DIAGNOSIS — U071 COVID-19: Secondary | ICD-10-CM | POA: Diagnosis not present

## 2020-02-02 DIAGNOSIS — U071 COVID-19: Secondary | ICD-10-CM | POA: Diagnosis not present

## 2020-02-02 DIAGNOSIS — Z20828 Contact with and (suspected) exposure to other viral communicable diseases: Secondary | ICD-10-CM | POA: Diagnosis not present

## 2020-02-04 DIAGNOSIS — Z20828 Contact with and (suspected) exposure to other viral communicable diseases: Secondary | ICD-10-CM | POA: Diagnosis not present

## 2020-02-04 DIAGNOSIS — U071 COVID-19: Secondary | ICD-10-CM | POA: Diagnosis not present

## 2020-02-09 DIAGNOSIS — U071 COVID-19: Secondary | ICD-10-CM | POA: Diagnosis not present

## 2020-02-09 DIAGNOSIS — Z20828 Contact with and (suspected) exposure to other viral communicable diseases: Secondary | ICD-10-CM | POA: Diagnosis not present

## 2020-02-11 DIAGNOSIS — U071 COVID-19: Secondary | ICD-10-CM | POA: Diagnosis not present

## 2020-02-11 DIAGNOSIS — Z20828 Contact with and (suspected) exposure to other viral communicable diseases: Secondary | ICD-10-CM | POA: Diagnosis not present

## 2020-02-16 DIAGNOSIS — U071 COVID-19: Secondary | ICD-10-CM | POA: Diagnosis not present

## 2020-02-16 DIAGNOSIS — Z20828 Contact with and (suspected) exposure to other viral communicable diseases: Secondary | ICD-10-CM | POA: Diagnosis not present

## 2020-02-17 DIAGNOSIS — N183 Chronic kidney disease, stage 3 unspecified: Secondary | ICD-10-CM | POA: Diagnosis not present

## 2020-02-18 DIAGNOSIS — Z20828 Contact with and (suspected) exposure to other viral communicable diseases: Secondary | ICD-10-CM | POA: Diagnosis not present

## 2020-02-18 DIAGNOSIS — U071 COVID-19: Secondary | ICD-10-CM | POA: Diagnosis not present

## 2020-02-22 DIAGNOSIS — D631 Anemia in chronic kidney disease: Secondary | ICD-10-CM | POA: Diagnosis not present

## 2020-02-22 DIAGNOSIS — I129 Hypertensive chronic kidney disease with stage 1 through stage 4 chronic kidney disease, or unspecified chronic kidney disease: Secondary | ICD-10-CM | POA: Diagnosis not present

## 2020-02-22 DIAGNOSIS — N1832 Chronic kidney disease, stage 3b: Secondary | ICD-10-CM | POA: Diagnosis not present

## 2020-02-22 DIAGNOSIS — N2581 Secondary hyperparathyroidism of renal origin: Secondary | ICD-10-CM | POA: Diagnosis not present

## 2020-02-24 DIAGNOSIS — Z20828 Contact with and (suspected) exposure to other viral communicable diseases: Secondary | ICD-10-CM | POA: Diagnosis not present

## 2020-02-24 DIAGNOSIS — U071 COVID-19: Secondary | ICD-10-CM | POA: Diagnosis not present

## 2020-02-25 DIAGNOSIS — U071 COVID-19: Secondary | ICD-10-CM | POA: Diagnosis not present

## 2020-02-25 DIAGNOSIS — Z20828 Contact with and (suspected) exposure to other viral communicable diseases: Secondary | ICD-10-CM | POA: Diagnosis not present

## 2020-03-01 DIAGNOSIS — Z20828 Contact with and (suspected) exposure to other viral communicable diseases: Secondary | ICD-10-CM | POA: Diagnosis not present

## 2020-03-01 DIAGNOSIS — U071 COVID-19: Secondary | ICD-10-CM | POA: Diagnosis not present

## 2020-03-03 DIAGNOSIS — Z20828 Contact with and (suspected) exposure to other viral communicable diseases: Secondary | ICD-10-CM | POA: Diagnosis not present

## 2020-03-03 DIAGNOSIS — U071 COVID-19: Secondary | ICD-10-CM | POA: Diagnosis not present

## 2020-03-08 DIAGNOSIS — Z20828 Contact with and (suspected) exposure to other viral communicable diseases: Secondary | ICD-10-CM | POA: Diagnosis not present

## 2020-03-08 DIAGNOSIS — U071 COVID-19: Secondary | ICD-10-CM | POA: Diagnosis not present

## 2020-03-15 DIAGNOSIS — U071 COVID-19: Secondary | ICD-10-CM | POA: Diagnosis not present

## 2020-03-15 DIAGNOSIS — Z20828 Contact with and (suspected) exposure to other viral communicable diseases: Secondary | ICD-10-CM | POA: Diagnosis not present

## 2020-03-17 DIAGNOSIS — U071 COVID-19: Secondary | ICD-10-CM | POA: Diagnosis not present

## 2020-03-17 DIAGNOSIS — Z20828 Contact with and (suspected) exposure to other viral communicable diseases: Secondary | ICD-10-CM | POA: Diagnosis not present

## 2020-03-22 DIAGNOSIS — U071 COVID-19: Secondary | ICD-10-CM | POA: Diagnosis not present

## 2020-03-22 DIAGNOSIS — Z20828 Contact with and (suspected) exposure to other viral communicable diseases: Secondary | ICD-10-CM | POA: Diagnosis not present

## 2020-03-24 DIAGNOSIS — U071 COVID-19: Secondary | ICD-10-CM | POA: Diagnosis not present

## 2020-03-24 DIAGNOSIS — Z20828 Contact with and (suspected) exposure to other viral communicable diseases: Secondary | ICD-10-CM | POA: Diagnosis not present

## 2020-03-29 DIAGNOSIS — Z20828 Contact with and (suspected) exposure to other viral communicable diseases: Secondary | ICD-10-CM | POA: Diagnosis not present

## 2020-03-29 DIAGNOSIS — U071 COVID-19: Secondary | ICD-10-CM | POA: Diagnosis not present

## 2020-03-30 ENCOUNTER — Other Ambulatory Visit: Payer: Self-pay | Admitting: Family Medicine

## 2020-03-31 DIAGNOSIS — Z20828 Contact with and (suspected) exposure to other viral communicable diseases: Secondary | ICD-10-CM | POA: Diagnosis not present

## 2020-03-31 DIAGNOSIS — U071 COVID-19: Secondary | ICD-10-CM | POA: Diagnosis not present

## 2020-04-04 DIAGNOSIS — H35431 Paving stone degeneration of retina, right eye: Secondary | ICD-10-CM | POA: Diagnosis not present

## 2020-04-04 DIAGNOSIS — H524 Presbyopia: Secondary | ICD-10-CM | POA: Diagnosis not present

## 2020-04-04 DIAGNOSIS — H40013 Open angle with borderline findings, low risk, bilateral: Secondary | ICD-10-CM | POA: Diagnosis not present

## 2020-04-04 DIAGNOSIS — H2513 Age-related nuclear cataract, bilateral: Secondary | ICD-10-CM | POA: Diagnosis not present

## 2020-04-04 DIAGNOSIS — H40053 Ocular hypertension, bilateral: Secondary | ICD-10-CM | POA: Diagnosis not present

## 2020-04-05 DIAGNOSIS — U071 COVID-19: Secondary | ICD-10-CM | POA: Diagnosis not present

## 2020-04-05 DIAGNOSIS — Z20828 Contact with and (suspected) exposure to other viral communicable diseases: Secondary | ICD-10-CM | POA: Diagnosis not present

## 2020-04-07 DIAGNOSIS — U071 COVID-19: Secondary | ICD-10-CM | POA: Diagnosis not present

## 2020-04-07 DIAGNOSIS — Z20828 Contact with and (suspected) exposure to other viral communicable diseases: Secondary | ICD-10-CM | POA: Diagnosis not present

## 2020-04-12 ENCOUNTER — Other Ambulatory Visit: Payer: Self-pay | Admitting: Family Medicine

## 2020-04-12 DIAGNOSIS — Z20828 Contact with and (suspected) exposure to other viral communicable diseases: Secondary | ICD-10-CM | POA: Diagnosis not present

## 2020-04-12 DIAGNOSIS — U071 COVID-19: Secondary | ICD-10-CM | POA: Diagnosis not present

## 2020-04-14 DIAGNOSIS — Z20828 Contact with and (suspected) exposure to other viral communicable diseases: Secondary | ICD-10-CM | POA: Diagnosis not present

## 2020-04-14 DIAGNOSIS — U071 COVID-19: Secondary | ICD-10-CM | POA: Diagnosis not present

## 2020-04-19 DIAGNOSIS — Z20828 Contact with and (suspected) exposure to other viral communicable diseases: Secondary | ICD-10-CM | POA: Diagnosis not present

## 2020-04-19 DIAGNOSIS — U071 COVID-19: Secondary | ICD-10-CM | POA: Diagnosis not present

## 2020-04-21 DIAGNOSIS — Z20828 Contact with and (suspected) exposure to other viral communicable diseases: Secondary | ICD-10-CM | POA: Diagnosis not present

## 2020-04-21 DIAGNOSIS — U071 COVID-19: Secondary | ICD-10-CM | POA: Diagnosis not present

## 2020-04-22 ENCOUNTER — Ambulatory Visit: Payer: PPO | Admitting: Family Medicine

## 2020-05-05 ENCOUNTER — Other Ambulatory Visit: Payer: Self-pay

## 2020-05-05 ENCOUNTER — Encounter: Payer: Self-pay | Admitting: Family Medicine

## 2020-05-05 ENCOUNTER — Ambulatory Visit (INDEPENDENT_AMBULATORY_CARE_PROVIDER_SITE_OTHER): Payer: PPO | Admitting: Family Medicine

## 2020-05-05 VITALS — BP 155/80 | HR 56 | Temp 97.6°F | Ht 67.0 in | Wt 175.0 lb

## 2020-05-05 DIAGNOSIS — E119 Type 2 diabetes mellitus without complications: Secondary | ICD-10-CM | POA: Diagnosis not present

## 2020-05-05 DIAGNOSIS — I10 Essential (primary) hypertension: Secondary | ICD-10-CM | POA: Diagnosis not present

## 2020-05-05 DIAGNOSIS — E785 Hyperlipidemia, unspecified: Secondary | ICD-10-CM

## 2020-05-05 DIAGNOSIS — N183 Chronic kidney disease, stage 3 unspecified: Secondary | ICD-10-CM

## 2020-05-05 DIAGNOSIS — Z125 Encounter for screening for malignant neoplasm of prostate: Secondary | ICD-10-CM

## 2020-05-05 LAB — POCT GLYCOSYLATED HEMOGLOBIN (HGB A1C): Hemoglobin A1C: 5.2 % (ref 4.0–5.6)

## 2020-05-05 NOTE — Progress Notes (Signed)
Patient ID: Hunter DENZ Sr., male    DOB: 10-02-1950, 70 y.o.   MRN: [id number]   Chief Complaint  Patient presents with  . Diabetes   Subjective:    HPI  F/u -htn/diabetic check up. Pt states no concerns. A1C 5.2 (in office.) On labs in 7/21- a1c- 6.2.    Not on medication for diabetes. Diet controlled.   Pt had covid 19 virus- 2 wks ago.  Took alka-seltzer and tylenol and feeling better.  Sneezing and cold sympotms. Fever and body aches.  Missed some days from work.  Working at nursing home.  Had bp this am and had meds.  occ gets high and saw 150s this am at home. norvasc 43m at night,  And taking carvedilol 6.241mbid.  Seeing kidney doctor. Cr elevated from 1.65- 1.8.   Normally running 13438Vystolic. Staying active and working at nursing home.  Went to eye doctor, and was normal. Rechecking in 39m53moWanting to recheck eye pressure, close to being glaucoma. Has some cataracts.   Medical History SteHirens a past medical history of Gout, Hypercholesteremia, Hypertension, Impaired glucose metabolism, PUD (peptic ulcer disease), Reflux, and Renal disorder.   Outpatient Encounter Medications as of 05/05/2020  Medication Sig  . amLODipine (NORVASC) 10 MG tablet Take 1 tablet by mouth once daily  . aspirin 81 MG tablet Take 81 mg by mouth daily.  . aMarland Kitchenorvastatin (LIPITOR) 40 MG tablet Take 1 tablet by mouth once daily  . carvedilol (COREG) 6.25 MG tablet TAKE 1 TABLET BY MOUTH TWICE DAILY WITH A MEAL  . diphenhydrAMINE (BENADRYL) 25 MG tablet Take 25-50 mg by mouth at bedtime as needed for sleep.   . fenofibrate 160 MG tablet Take 1 tablet (160 mg total) by mouth at bedtime.  . oMarland Kitcheneprazole (PRILOSEC) 20 MG capsule Take 20 mg by mouth daily as needed (for acid reflux).   No facility-administered encounter medications on file as of 05/05/2020.     Review of Systems  Constitutional: Negative for chills and fever.  HENT: Negative for congestion, rhinorrhea and sore  throat.   Respiratory: Negative for cough, shortness of breath and wheezing.   Cardiovascular: Negative for chest pain and leg swelling.  Gastrointestinal: Negative for abdominal pain, diarrhea, nausea and vomiting.  Genitourinary: Negative for dysuria and frequency.  Skin: Negative for rash.  Neurological: Negative for dizziness, weakness and headaches.     Vitals BP (!) 155/80   Pulse (!) 56   Temp 97.6 F (36.4 C)   Ht _0  (1.702 m)   Wt 175 lb (79.4 kg)   SpO2 98%   BMI 27.41 kg/m   Objective:   Physical Exam Vitals and nursing note reviewed.  Constitutional:      General: He is not in acute distress.    Appearance: Normal appearance. He is not ill-appearing.  HENT:     Head: Normocephalic.     Nose: Nose normal. No congestion.     Mouth/Throat:     Mouth: Mucous membranes are moist.     Pharynx: No oropharyngeal exudate.  Eyes:     Extraocular Movements: Extraocular movements intact.     Conjunctiva/sclera: Conjunctivae normal.     Pupils: Pupils are equal, round, and reactive to light.  Cardiovascular:     Rate and Rhythm: Regular rhythm. Bradycardia present.     Pulses: Normal pulses.     Heart sounds: Normal heart sounds. No murmur heard.   Pulmonary:     Effort:  Pulmonary effort is normal.     Breath sounds: Normal breath sounds. No wheezing, rhonchi or rales.  Musculoskeletal:        General: Normal range of motion.     Right lower leg: No edema.     Left lower leg: No edema.  Skin:    General: Skin is warm and dry.     Findings: No rash.  Neurological:     General: No focal deficit present.     Mental Status: He is alert and oriented to person, place, and time.     Cranial Nerves: No cranial nerve deficit.  Psychiatric:        Mood and Affect: Mood normal.        Behavior: Behavior normal.        Thought Content: Thought content normal.        Judgment: Judgment normal.      Assessment and Plan   1. Primary hypertension - CBC -  CMP14+EGFR - Lipid panel  2. Diabetes mellitus without complication ([place]) - POCT glycosylated hemoglobin (Hb A1C) - Hemoglobin A1c  3. Chronic renal failure, stage 3 (moderate), unspecified whether stage 3a or 3b CKD ([place])  4. Hyperlipidemia LDL goal <100  5. Screening PSA (prostate specific antigen) - PSA; Future   htn- suboptimal.  Pt to watch salt.  Call if still seeing 150s in evening, may need to increase his coreg.   CKD stage 3- stable. Cr 1.85. Pt to f/u with nephrologist about the ckd 3.  Dm2- stable. Diet controlled.  Cont to watch carbs in diet.  HLD- stable, improved. Cont lipitor.  F/u [address] prn.

## 2020-06-20 DIAGNOSIS — L821 Other seborrheic keratosis: Secondary | ICD-10-CM | POA: Diagnosis not present

## 2020-06-20 DIAGNOSIS — L820 Inflamed seborrheic keratosis: Secondary | ICD-10-CM | POA: Diagnosis not present

## 2020-06-29 ENCOUNTER — Other Ambulatory Visit: Payer: Self-pay | Admitting: Family Medicine

## 2020-07-04 DIAGNOSIS — N1832 Chronic kidney disease, stage 3b: Secondary | ICD-10-CM | POA: Diagnosis not present

## 2020-07-08 ENCOUNTER — Other Ambulatory Visit: Payer: Self-pay | Admitting: Family Medicine

## 2020-07-13 ENCOUNTER — Other Ambulatory Visit: Payer: Self-pay | Admitting: Family Medicine

## 2020-07-13 DIAGNOSIS — N183 Chronic kidney disease, stage 3 unspecified: Secondary | ICD-10-CM

## 2020-07-13 DIAGNOSIS — N2581 Secondary hyperparathyroidism of renal origin: Secondary | ICD-10-CM | POA: Diagnosis not present

## 2020-07-13 DIAGNOSIS — I129 Hypertensive chronic kidney disease with stage 1 through stage 4 chronic kidney disease, or unspecified chronic kidney disease: Secondary | ICD-10-CM | POA: Diagnosis not present

## 2020-07-13 DIAGNOSIS — I10 Essential (primary) hypertension: Secondary | ICD-10-CM

## 2020-07-13 DIAGNOSIS — D631 Anemia in chronic kidney disease: Secondary | ICD-10-CM | POA: Diagnosis not present

## 2020-07-13 DIAGNOSIS — Z125 Encounter for screening for malignant neoplasm of prostate: Secondary | ICD-10-CM

## 2020-07-13 DIAGNOSIS — E785 Hyperlipidemia, unspecified: Secondary | ICD-10-CM

## 2020-07-13 DIAGNOSIS — N1832 Chronic kidney disease, stage 3b: Secondary | ICD-10-CM | POA: Diagnosis not present

## 2020-07-13 DIAGNOSIS — E119 Type 2 diabetes mellitus without complications: Secondary | ICD-10-CM

## 2020-07-14 NOTE — Telephone Encounter (Signed)
Lab Results  Component Value Date   HGBA1C 5.2 05/05/2020    Lab Results  Component Value Date   CREATININE 1.85 (H) 10/23/2019     Lab Results  Component Value Date   CHOL 138 10/23/2019   HDL 36 (L) 10/23/2019   LDLCALC 84 10/23/2019   TRIG 94 10/23/2019   CHOLHDL 3.8 10/23/2019     BP Readings from Last 3 Encounters:  05/05/20 (!) 155/80  10/14/19 132/84  06/19/18 138/76

## 2020-07-14 NOTE — Telephone Encounter (Signed)
When can he get labs. They are over due. Gave to him on his last visit. Thx. Dr. Lovena Le

## 2020-07-15 NOTE — Telephone Encounter (Signed)
Left message to return call 

## 2020-07-15 NOTE — Addendum Note (Signed)
Addended by: Dairl Ponder on: 07/15/2020 02:54 PM   Modules accepted: Orders

## 2020-07-15 NOTE — Telephone Encounter (Signed)
Patient notified and will have lab work drawn

## 2020-07-15 NOTE — Addendum Note (Signed)
Addended by: Dairl Ponder on: 07/15/2020 02:51 PM   Modules accepted: Orders

## 2020-07-20 DIAGNOSIS — E119 Type 2 diabetes mellitus without complications: Secondary | ICD-10-CM | POA: Diagnosis not present

## 2020-07-20 DIAGNOSIS — I10 Essential (primary) hypertension: Secondary | ICD-10-CM | POA: Diagnosis not present

## 2020-07-20 DIAGNOSIS — N183 Chronic kidney disease, stage 3 unspecified: Secondary | ICD-10-CM | POA: Diagnosis not present

## 2020-07-20 DIAGNOSIS — E785 Hyperlipidemia, unspecified: Secondary | ICD-10-CM | POA: Diagnosis not present

## 2020-07-20 DIAGNOSIS — Z125 Encounter for screening for malignant neoplasm of prostate: Secondary | ICD-10-CM | POA: Diagnosis not present

## 2020-07-21 LAB — COMPREHENSIVE METABOLIC PANEL
ALT: 19 IU/L (ref 0–44)
AST: 25 IU/L (ref 0–40)
Albumin/Globulin Ratio: 2 (ref 1.2–2.2)
Albumin: 4.5 g/dL (ref 3.8–4.8)
Alkaline Phosphatase: 68 IU/L (ref 44–121)
BUN/Creatinine Ratio: 15 (ref 10–24)
BUN: 30 mg/dL — ABNORMAL HIGH (ref 8–27)
Bilirubin Total: 0.4 mg/dL (ref 0.0–1.2)
CO2: 18 mmol/L — ABNORMAL LOW (ref 20–29)
Calcium: 9.4 mg/dL (ref 8.6–10.2)
Chloride: 105 mmol/L (ref 96–106)
Creatinine, Ser: 1.95 mg/dL — ABNORMAL HIGH (ref 0.76–1.27)
Globulin, Total: 2.2 g/dL (ref 1.5–4.5)
Glucose: 138 mg/dL — ABNORMAL HIGH (ref 65–99)
Potassium: 4.7 mmol/L (ref 3.5–5.2)
Sodium: 142 mmol/L (ref 134–144)
Total Protein: 6.7 g/dL (ref 6.0–8.5)
eGFR: 37 mL/min/{1.73_m2} — ABNORMAL LOW (ref >59)

## 2020-07-21 LAB — PSA: Prostate Specific Ag, Serum: 1.9 ng/mL (ref 0.0–4.0)

## 2020-07-21 LAB — LIPID PANEL
Chol/HDL Ratio: 3.9 ratio (ref 0.0–5.0)
Cholesterol, Total: 142 mg/dL (ref 100–199)
HDL: 36 mg/dL — ABNORMAL LOW (ref >39)
LDL Chol Calc (NIH): 89 mg/dL (ref 0–99)
Triglycerides: 91 mg/dL (ref 0–149)
VLDL Cholesterol Cal: 17 mg/dL (ref 5–40)

## 2020-07-21 LAB — CBC WITH DIFFERENTIAL/PLATELET
Basophils Absolute: 0.1 10*3/uL (ref 0.0–0.2)
Basos: 1 %
EOS (ABSOLUTE): 0.3 10*3/uL (ref 0.0–0.4)
Eos: 4 %
Hematocrit: 42.6 % (ref 37.5–51.0)
Hemoglobin: 13.9 g/dL (ref 13.0–17.7)
Immature Grans (Abs): 0 10*3/uL (ref 0.0–0.1)
Immature Granulocytes: 0 %
Lymphocytes Absolute: 1.6 10*3/uL (ref 0.7–3.1)
Lymphs: 28 %
MCH: 29.3 pg (ref 26.6–33.0)
MCHC: 32.6 g/dL (ref 31.5–35.7)
MCV: 90 fL (ref 79–97)
Monocytes Absolute: 0.6 10*3/uL (ref 0.1–0.9)
Monocytes: 10 %
Neutrophils Absolute: 3.2 10*3/uL (ref 1.4–7.0)
Neutrophils: 57 %
Platelets: 172 10*3/uL (ref 150–450)
RBC: 4.75 x10E6/uL (ref 4.14–5.80)
RDW: 13 % (ref 11.6–15.4)
WBC: 5.7 10*3/uL (ref 3.4–10.8)

## 2020-07-21 LAB — HEMOGLOBIN A1C
Est. average glucose Bld gHb Est-mCnc: 134 mg/dL
Hgb A1c MFr Bld: 6.3 % — ABNORMAL HIGH (ref 4.8–5.6)

## 2020-07-22 MED ORDER — ATORVASTATIN CALCIUM 40 MG PO TABS: ORAL_TABLET | ORAL | 1 refills | Status: DC

## 2020-07-22 MED ORDER — FENOFIBRATE 160 MG PO TABS: 160.0000 mg | ORAL_TABLET | Freq: Every day | ORAL | 1 refills | Status: DC

## 2020-07-22 MED ORDER — CARVEDILOL 6.25 MG PO TABS: ORAL_TABLET | ORAL | 0 refills | Status: DC

## 2020-07-22 NOTE — Addendum Note (Signed)
Addended by: Erven Colla on: 07/22/2020 03:54 PM   Modules accepted: Orders

## 2020-09-09 ENCOUNTER — Telehealth: Payer: Self-pay | Admitting: Family Medicine

## 2020-09-09 NOTE — Telephone Encounter (Signed)
Left message for patient to call back and schedule Medicare Annual Wellness Visit (AWV) in office.   If not able to come in office, please offer to do virtually or by telephone.   Last AWV:06/19/2018   Please schedule at anytime with Nurse Health Advisor.

## 2020-10-03 ENCOUNTER — Other Ambulatory Visit: Payer: Self-pay | Admitting: Family Medicine

## 2020-10-03 DIAGNOSIS — H40013 Open angle with borderline findings, low risk, bilateral: Secondary | ICD-10-CM | POA: Diagnosis not present

## 2020-11-03 ENCOUNTER — Other Ambulatory Visit: Payer: Self-pay | Admitting: Family Medicine

## 2020-11-04 ENCOUNTER — Other Ambulatory Visit: Payer: Self-pay

## 2020-11-04 ENCOUNTER — Ambulatory Visit (INDEPENDENT_AMBULATORY_CARE_PROVIDER_SITE_OTHER): Payer: PPO | Admitting: Family Medicine

## 2020-11-04 VITALS — BP 152/74 | HR 50 | Temp 97.5°F | Ht 67.0 in | Wt 169.0 lb

## 2020-11-04 DIAGNOSIS — N183 Chronic kidney disease, stage 3 unspecified: Secondary | ICD-10-CM | POA: Diagnosis not present

## 2020-11-04 DIAGNOSIS — I10 Essential (primary) hypertension: Secondary | ICD-10-CM | POA: Diagnosis not present

## 2020-11-04 DIAGNOSIS — E785 Hyperlipidemia, unspecified: Secondary | ICD-10-CM

## 2020-11-04 DIAGNOSIS — E119 Type 2 diabetes mellitus without complications: Secondary | ICD-10-CM | POA: Diagnosis not present

## 2020-11-04 MED ORDER — CARVEDILOL 6.25 MG PO TABS: ORAL_TABLET | ORAL | 0 refills | Status: DC

## 2020-11-04 MED ORDER — ATORVASTATIN CALCIUM 40 MG PO TABS: ORAL_TABLET | ORAL | 1 refills | Status: DC

## 2020-11-04 MED ORDER — AMLODIPINE BESYLATE 10 MG PO TABS: 10.0000 mg | ORAL_TABLET | Freq: Every day | ORAL | 1 refills | Status: DC

## 2020-11-04 MED ORDER — FENOFIBRATE 160 MG PO TABS: 160.0000 mg | ORAL_TABLET | Freq: Every day | ORAL | 1 refills | Status: DC

## 2020-11-04 NOTE — Progress Notes (Signed)
Patient ID: Hunter LINCH Sr., male    DOB: 13-Mar-1951, 70 y.o.   MRN: [id number]   Chief Complaint  Patient presents with   Hypertension    Follow up    Subjective:    HPI  Has DM2-diet modification-lost about 15 lbs and working on diet and exercising. And not on meds for dm.  Seeing Dr. Posey Pronto has h/o stage 3 ckd. Cr ranging 1.7- 1.9.  HTN Pt compliant with BP meds.  No SEs Denies chest pain, sob, LE swelling, or blurry vision.  Taking bp meds at night.  130-140/ 60-70 at home.  Hyperlipidemia-patient stating he is stable on his medications.  Not having any side effects.  Is taking a atorvastatin and fenofibrate.  Medical History Jamarl has a past medical history of Gout, Hypercholesteremia, Hypertension, Impaired glucose metabolism, PUD (peptic ulcer disease), Reflux, and Renal disorder.   Outpatient Encounter Medications as of 11/04/2020  Medication Sig   aspirin 81 MG tablet Take 81 mg by mouth daily.   diphenhydrAMINE (BENADRYL) 25 MG tablet Take 25-50 mg by mouth at bedtime as needed for sleep.    omeprazole (PRILOSEC) 20 MG capsule Take 20 mg by mouth daily as needed (for acid reflux).   [DISCONTINUED] amLODipine (NORVASC) 10 MG tablet Take 1 tablet by mouth once daily   [DISCONTINUED] atorvastatin (LIPITOR) 40 MG tablet Take 1 tablet by mouth once daily. NEEDS LABS.   [DISCONTINUED] carvedilol (COREG) 6.25 MG tablet TAKE 1 TABLET BY MOUTH TWICE DAILY WITH A MEAL. NEEDS LABS.   [DISCONTINUED] fenofibrate 160 MG tablet Take 1 tablet (160 mg total) by mouth at bedtime.   amLODipine (NORVASC) 10 MG tablet Take 1 tablet (10 mg total) by mouth daily.   atorvastatin (LIPITOR) 40 MG tablet Take 1 tablet by mouth once daily.   carvedilol (COREG) 6.25 MG tablet TAKE 1 TABLET BY MOUTH TWICE DAILY WITH A MEAL. NEEDS LABS.   fenofibrate 160 MG tablet Take 1 tablet (160 mg total) by mouth at bedtime.   No facility-administered encounter medications on file as of 11/04/2020.      Review of Systems  Constitutional:  Negative for chills and fever.  HENT:  Negative for congestion, rhinorrhea and sore throat.   Respiratory:  Negative for cough, shortness of breath and wheezing.   Cardiovascular:  Negative for chest pain and leg swelling.  Gastrointestinal:  Negative for abdominal pain, diarrhea, nausea and vomiting.  Genitourinary:  Negative for dysuria and frequency.  Skin:  Negative for rash.  Neurological:  Negative for dizziness, weakness and headaches.    Vitals BP (!) 152/74   Pulse (!) 50   Temp (!) 97.5 F (36.4 C)   Ht 5' 7" (1.702 m)   Wt 169 lb (76.7 kg)   SpO2 99%   BMI 26.47 kg/m   Objective:   Physical Exam Vitals and nursing note reviewed.  Constitutional:      General: He is not in acute distress.    Appearance: Normal appearance. He is not ill-appearing.  HENT:     Head: Normocephalic.  Eyes:     Extraocular Movements: Extraocular movements intact.     Conjunctiva/sclera: Conjunctivae normal.     Pupils: Pupils are equal, round, and reactive to light.  Cardiovascular:     Rate and Rhythm: Regular rhythm. Bradycardia present.     Pulses: Normal pulses.     Heart sounds: Normal heart sounds. No murmur heard. Pulmonary:     Effort: Pulmonary effort is normal.  Breath sounds: Normal breath sounds. No wheezing, rhonchi or rales.  Musculoskeletal:        General: Normal range of motion.     Right lower leg: No edema.     Left lower leg: No edema.  Skin:    General: Skin is warm and dry.     Findings: No rash.  Neurological:     General: No focal deficit present.     Mental Status: He is alert and oriented to person, place, and time.     Cranial Nerves: No cranial nerve deficit.  Psychiatric:        Mood and Affect: Mood normal.        Behavior: Behavior normal.        Thought Content: Thought content normal.        Judgment: Judgment normal.     Assessment and Plan   1. Primary hypertension - CMP14+EGFR -  Ambulatory referral to Nephrology  2. Chronic renal failure, stage 3 (moderate), unspecified whether stage 3a or 3b CKD ([place]) - CMP14+EGFR - Ambulatory referral to Nephrology  3. Hyperlipidemia LDL goal <100 - Lipid panel  4. Type 2 diabetes mellitus without complication, without long-term current use of insulin ([facility]) - Hemoglobin A1c   Hypertension-uncontrolled, patient to continue medications and check home blood pressure.  And if seeing numbers over 491 systolic to call the office. CKD stage III.  Patient to continue follow-up with nephrology.  We will recheck labs today. Hyperlipidemia-stable-continue medications Diabetes type 2-patient is not currently on medications.  Patient is doing diet modification.  Patient A1c is at goal, at 6.2.    Return in about 6 months (around 05/07/2021) for Follow-up, hypertension, diabetes, hyperlipidemia.

## 2020-11-05 LAB — CMP14+EGFR
ALT: 23 IU/L (ref 0–44)
AST: 32 IU/L (ref 0–40)
Albumin/Globulin Ratio: 1.9 (ref 1.2–2.2)
Albumin: 4.5 g/dL (ref 3.8–4.8)
Alkaline Phosphatase: 50 IU/L (ref 44–121)
BUN/Creatinine Ratio: 15 (ref 10–24)
BUN: 26 mg/dL (ref 8–27)
Bilirubin Total: 0.6 mg/dL (ref 0.0–1.2)
CO2: 21 mmol/L (ref 20–29)
Calcium: 10.1 mg/dL (ref 8.6–10.2)
Chloride: 106 mmol/L (ref 96–106)
Creatinine, Ser: 1.76 mg/dL — ABNORMAL HIGH (ref 0.76–1.27)
Globulin, Total: 2.4 g/dL (ref 1.5–4.5)
Glucose: 135 mg/dL — ABNORMAL HIGH (ref 65–99)
Potassium: 4.6 mmol/L (ref 3.5–5.2)
Sodium: 142 mmol/L (ref 134–144)
Total Protein: 6.9 g/dL (ref 6.0–8.5)
eGFR: 41 mL/min/1.73 — ABNORMAL LOW

## 2020-11-05 LAB — LIPID PANEL
Chol/HDL Ratio: 4.2 ratio (ref 0.0–5.0)
Cholesterol, Total: 152 mg/dL (ref 100–199)
HDL: 36 mg/dL — ABNORMAL LOW (ref >39)
LDL Chol Calc (NIH): 93 mg/dL (ref 0–99)
Triglycerides: 127 mg/dL (ref 0–149)
VLDL Cholesterol Cal: 23 mg/dL (ref 5–40)

## 2020-11-05 LAB — HEMOGLOBIN A1C
Est. average glucose Bld gHb Est-mCnc: 131 mg/dL
Hgb A1c MFr Bld: 6.2 % — ABNORMAL HIGH (ref 4.8–5.6)

## 2020-11-08 NOTE — Progress Notes (Signed)
Patient returned the call and was informed per drs lab results and recommendations, patient verbalizes understanding and agrees to have the referral to nephrology.

## 2021-02-21 DIAGNOSIS — N1832 Chronic kidney disease, stage 3b: Secondary | ICD-10-CM | POA: Diagnosis not present

## 2021-02-27 ENCOUNTER — Other Ambulatory Visit: Payer: Self-pay | Admitting: Family Medicine

## 2021-03-01 DIAGNOSIS — D631 Anemia in chronic kidney disease: Secondary | ICD-10-CM | POA: Diagnosis not present

## 2021-03-01 DIAGNOSIS — N1832 Chronic kidney disease, stage 3b: Secondary | ICD-10-CM | POA: Diagnosis not present

## 2021-03-01 DIAGNOSIS — N2581 Secondary hyperparathyroidism of renal origin: Secondary | ICD-10-CM | POA: Diagnosis not present

## 2021-03-01 DIAGNOSIS — I129 Hypertensive chronic kidney disease with stage 1 through stage 4 chronic kidney disease, or unspecified chronic kidney disease: Secondary | ICD-10-CM | POA: Diagnosis not present

## 2021-03-23 DIAGNOSIS — C44319 Basal cell carcinoma of skin of other parts of face: Secondary | ICD-10-CM | POA: Diagnosis not present

## 2021-03-23 DIAGNOSIS — D2239 Melanocytic nevi of other parts of face: Secondary | ICD-10-CM | POA: Diagnosis not present

## 2021-04-04 DIAGNOSIS — H524 Presbyopia: Secondary | ICD-10-CM | POA: Diagnosis not present

## 2021-04-04 DIAGNOSIS — H40011 Open angle with borderline findings, low risk, right eye: Secondary | ICD-10-CM | POA: Diagnosis not present

## 2021-04-04 LAB — HM DIABETES EYE EXAM

## 2021-05-25 ENCOUNTER — Other Ambulatory Visit: Payer: Self-pay

## 2021-05-25 ENCOUNTER — Encounter: Payer: Self-pay | Admitting: Internal Medicine

## 2021-05-25 ENCOUNTER — Ambulatory Visit (INDEPENDENT_AMBULATORY_CARE_PROVIDER_SITE_OTHER): Payer: PPO | Admitting: Internal Medicine

## 2021-05-25 VITALS — BP 132/78 | HR 59 | Resp 18 | Ht 67.0 in | Wt 181.1 lb

## 2021-05-25 DIAGNOSIS — Z23 Encounter for immunization: Secondary | ICD-10-CM

## 2021-05-25 DIAGNOSIS — E1122 Type 2 diabetes mellitus with diabetic chronic kidney disease: Secondary | ICD-10-CM

## 2021-05-25 DIAGNOSIS — N183 Chronic kidney disease, stage 3 unspecified: Secondary | ICD-10-CM | POA: Diagnosis not present

## 2021-05-25 DIAGNOSIS — R011 Cardiac murmur, unspecified: Secondary | ICD-10-CM

## 2021-05-25 DIAGNOSIS — I10 Essential (primary) hypertension: Secondary | ICD-10-CM | POA: Diagnosis not present

## 2021-05-25 DIAGNOSIS — E785 Hyperlipidemia, unspecified: Secondary | ICD-10-CM

## 2021-05-25 DIAGNOSIS — N2581 Secondary hyperparathyroidism of renal origin: Secondary | ICD-10-CM

## 2021-05-25 DIAGNOSIS — Z2821 Immunization not carried out because of patient refusal: Secondary | ICD-10-CM | POA: Diagnosis not present

## 2021-05-25 DIAGNOSIS — N1832 Chronic kidney disease, stage 3b: Secondary | ICD-10-CM | POA: Diagnosis not present

## 2021-05-25 LAB — POCT GLYCOSYLATED HEMOGLOBIN (HGB A1C): HbA1c, POC (controlled diabetic range): 6.5 % (ref 0.0–7.0)

## 2021-05-25 MED ORDER — FENOFIBRATE 160 MG PO TABS: 160.0000 mg | ORAL_TABLET | Freq: Every day | ORAL | 1 refills | Status: DC

## 2021-05-25 MED ORDER — AMLODIPINE BESYLATE 10 MG PO TABS: 10.0000 mg | ORAL_TABLET | Freq: Every day | ORAL | 1 refills | Status: DC

## 2021-05-25 MED ORDER — ATORVASTATIN CALCIUM 40 MG PO TABS: ORAL_TABLET | ORAL | 1 refills | Status: DC

## 2021-05-25 MED ORDER — CARVEDILOL 6.25 MG PO TABS: 6.2500 mg | ORAL_TABLET | Freq: Two times a day (BID) | ORAL | 1 refills | Status: DC

## 2021-05-25 NOTE — Assessment & Plan Note (Signed)
BP Readings from Last 1 Encounters:  05/25/21 132/78   Well-controlled with amlodipine and Coreg Counseled for compliance with the medications Advised DASH diet and moderate exercise/walking as tolerated

## 2021-05-25 NOTE — Assessment & Plan Note (Signed)
Followed by nephrology. 

## 2021-05-25 NOTE — Assessment & Plan Note (Signed)
Lab Results  Component Value Date   HGBA1C 6.5 05/25/2021    Diet controlled Advised to follow diabetic diet On statin F/u CMP and lipid panel Diabetic foot exam: Today Diabetic eye exam: Advised to follow up with Ophthalmology for diabetic eye exam

## 2021-05-25 NOTE — Assessment & Plan Note (Signed)
Followed by Dr. Kizzie Cotten-nephrology Avoid nephrotoxic agents including NSAIDs

## 2021-05-25 NOTE — Assessment & Plan Note (Signed)
Most likely benign murmur We will get baseline Echo

## 2021-05-25 NOTE — Patient Instructions (Signed)
Please continue taking medications as prescribed.  Please continue to follow low salt diet and ambulate as tolerated.  Please increase fluid intake to at least 64 ounces in a day.  You are being scheduled to get Echo done.

## 2021-05-25 NOTE — Assessment & Plan Note (Signed)
On statin and fenofibrate currently We will check lipid profile in the next visit 

## 2021-05-25 NOTE — Progress Notes (Addendum)
New Patient Office Visit  Subjective:  Patient ID: Hunter Fillinger., male    DOB: 1951-02-27  Age: 71 y.o. MRN: [id number]  CC:  Chief Complaint  Patient presents with   New Patient (Initial Visit)    New patient was seeing dr Wolfgang Phoenix no issues just establishing care     HPI Hunter INDELICATO Sr. is a 71 y.o. male with past medical history of HTN, type II DM, CKD 3 and HLD who presents for establishing care.  HTN: BP is well-controlled. Takes medications regularly. Patient denies headache, dizziness, chest pain, dyspnea or palpitations.  Type II DM: Diet controlled.  His HbA1c was 6.5 in the office today.  He denies any polyuria or polydipsia currently.  He strictly follows low-carb diet and does not want to take any medication for it currently.  CKD stage III: He follows up with Dr. Posey Pronto in [place].  He denies any dysuria or hematuria currently.  He reports history of undescended testis and having renal anomaly due to it.  Cardiac murmur: He has systolic murmur from his childhood.  He denies any dyspnea, dizziness, leg swelling or episodes of being recently.  Has not had any echo done yet.  He received PPSV23 in the office today.  Past Medical History:  Diagnosis Date   Gall bladder disease 09/29/2014   Gout    Hypercholesteremia    Hypertension    Impaired glucose metabolism    PUD (peptic ulcer disease)    Reflux    Renal disorder    1 kidney that is working at 45 %.    Past Surgical History:  Procedure Laterality Date   ABDOMINAL SURGERY     CHOLECYSTECTOMY N/A 09/30/2014   Procedure: LAPAROSCOPIC CHOLECYSTECTOMY WITH INTRAOPERATIVE CHOLANGIOGRAM;  Surgeon: Alphonsa Overall, MD;  Location: [facility];  Service: General;  Laterality: N/A;   COLONOSCOPY N/A 03/03/2015   Procedure: COLONOSCOPY;  Surgeon: Rogene Houston, MD;  Location: [facility];  Service: Endoscopy;  Laterality: N/A;  31    Family History  Problem Relation Age of Onset   Diabetes Mother    Heart attack  Mother    Hypertension Sister     Social History   Socioeconomic History   Marital status: Married    Spouse name: Not on file   Number of children: Not on file   Years of education: Not on file   Highest education level: Not on file  Occupational History   Not on file  Tobacco Use   Smoking status: Former    Types: Cigarettes    Quit date: 08/01/1996    Years since quitting: 24.8   Smokeless tobacco: Never  Substance and Sexual Activity   Alcohol use: No   Drug use: No   Sexual activity: Not on file  Other Topics Concern   Not on file  Social History Narrative   Not on file   Social Determinants of Health   Financial Resource Strain: Not on file  Food Insecurity: Not on file  Transportation Needs: Not on file  Physical Activity: Not on file  Stress: Not on file  Social Connections: Not on file  Intimate Partner Violence: Not on file    ROS Review of Systems  Constitutional:  Negative for chills and fever.  HENT:  Negative for congestion and sore throat.   Eyes:  Negative for pain and discharge.  Respiratory:  Negative for cough and shortness of breath.   Cardiovascular:  Negative for chest pain  and palpitations.  Gastrointestinal:  Negative for constipation, diarrhea, nausea and vomiting.  Endocrine: Negative for polydipsia and polyuria.  Genitourinary:  Negative for dysuria and hematuria.  Musculoskeletal:  Negative for neck pain and neck stiffness.  Skin:  Negative for rash.  Neurological:  Negative for dizziness, weakness, numbness and headaches.  Psychiatric/Behavioral:  Negative for agitation and behavioral problems.    Objective:   Today's Vitals: BP 132/78 (BP Location: Left Arm, Patient Position: Sitting, Cuff Size: Normal)    Pulse (!) 59    Resp 18    Ht 5\' 7"  (1.702 m)    Wt 181 lb 1.9 oz (82.2 kg)    SpO2 97%    BMI 28.37 kg/m   Physical Exam Vitals reviewed.  Constitutional:      General: He is not in acute distress.    Appearance: He is  not diaphoretic.  HENT:     Head: Normocephalic and atraumatic.     Nose: Nose normal.     Mouth/Throat:     Mouth: Mucous membranes are moist.  Eyes:     General: No scleral icterus.    Extraocular Movements: Extraocular movements intact.  Cardiovascular:     Rate and Rhythm: Normal rate and regular rhythm.     Pulses: Normal pulses.     Heart sounds: Murmur (Systolic, over sternal border) heard.  Pulmonary:     Breath sounds: Normal breath sounds. No wheezing or rales.  Abdominal:     Palpations: Abdomen is soft.     Tenderness: There is no abdominal tenderness.  Musculoskeletal:     Cervical back: Neck supple. No tenderness.     Right lower leg: No edema.     Left lower leg: No edema.  Skin:    General: Skin is warm.     Findings: No rash.  Neurological:     General: No focal deficit present.     Mental Status: He is alert and oriented to person, place, and time.     Sensory: No sensory deficit.     Motor: No weakness.  Psychiatric:        Mood and Affect: Mood normal.        Behavior: Behavior normal.    Assessment & Plan:   Problem List Items Addressed This Visit       Cardiovascular and Mediastinum   HTN (hypertension) - Primary    BP Readings from Last 1 Encounters:  05/25/21 132/78  Well-controlled with amlodipine and Coreg Counseled for compliance with the medications Advised DASH diet and moderate exercise/walking as tolerated       Relevant Medications   amLODipine (NORVASC) 10 MG tablet   atorvastatin (LIPITOR) 40 MG tablet   fenofibrate 160 MG tablet   carvedilol (COREG) 6.25 MG tablet     Endocrine   Controlled type 2 diabetes mellitus with chronic kidney disease ([place])    Lab Results  Component Value Date   HGBA1C 6.5 05/25/2021   Diet controlled Advised to follow diabetic diet On statin F/u CMP and lipid panel Diabetic foot exam: Today Diabetic eye exam: Advised to follow up with Ophthalmology for diabetic eye exam      Relevant  Medications   atorvastatin (LIPITOR) 40 MG tablet   Other Relevant Orders   Microalbumin, urine   POCT glycosylated hemoglobin (Hb A1C) (Completed)   Secondary hyperparathyroidism of renal origin ([place])    Followed by nephrology        Genitourinary   Chronic renal failure, stage  3 (moderate) (HCC)    Followed by Dr. Duston Smolenski-nephrology Avoid nephrotoxic agents including NSAIDs        Other   Hyperlipidemia LDL goal <100    On statin and fenofibrate currently We will check lipid profile in the next visit      Relevant Medications   amLODipine (NORVASC) 10 MG tablet   atorvastatin (LIPITOR) 40 MG tablet   fenofibrate 160 MG tablet   carvedilol (COREG) 6.25 MG tablet   Systolic murmur    Most likely benign murmur We will get baseline Echo      Relevant Orders   ECHOCARDIOGRAM COMPLETE   Other Visit Diagnoses     Need for pneumococcal vaccination       Relevant Orders   Pneumococcal polysaccharide vaccine 23-valent greater than or equal to 2yo subcutaneous/IM (Completed)   Refused influenza vaccine       Need for varicella vaccine       Relevant Orders   Varicella-zoster vaccine IM (Shingrix) (Completed)       Outpatient Encounter Medications as of 05/25/2021  Medication Sig   aspirin 81 MG tablet Take 81 mg by mouth daily.   diphenhydrAMINE (BENADRYL) 25 MG tablet Take 25-50 mg by mouth at bedtime as needed for sleep.    omeprazole (PRILOSEC) 20 MG capsule Take 20 mg by mouth daily as needed (for acid reflux).   [DISCONTINUED] amLODipine (NORVASC) 10 MG tablet Take 1 tablet (10 mg total) by mouth daily.   [DISCONTINUED] atorvastatin (LIPITOR) 40 MG tablet Take 1 tablet by mouth once daily.   [DISCONTINUED] carvedilol (COREG) 6.25 MG tablet TAKE 1 TABLET BY MOUTH TWICE DAILY WITH A MEAL. NEEDS LABS.   [DISCONTINUED] fenofibrate 160 MG tablet Take 1 tablet (160 mg total) by mouth at bedtime.   amLODipine (NORVASC) 10 MG tablet Take 1 tablet (10 mg total) by mouth  daily.   atorvastatin (LIPITOR) 40 MG tablet Take 1 tablet by mouth once daily.   carvedilol (COREG) 6.25 MG tablet Take 1 tablet (6.25 mg total) by mouth 2 (two) times daily with a meal.   fenofibrate 160 MG tablet Take 1 tablet (160 mg total) by mouth at bedtime.   No facility-administered encounter medications on file as of 05/25/2021.    Follow-up: Return in about 6 months (around 11/22/2021) for Annual physical.   Lindell Spar, MD

## 2021-06-01 ENCOUNTER — Encounter: Payer: Self-pay | Admitting: *Deleted

## 2021-06-01 ENCOUNTER — Ambulatory Visit (INDEPENDENT_AMBULATORY_CARE_PROVIDER_SITE_OTHER): Payer: PPO

## 2021-06-01 ENCOUNTER — Other Ambulatory Visit: Payer: Self-pay

## 2021-06-01 VITALS — Wt 181.0 lb

## 2021-06-01 DIAGNOSIS — Z0000 Encounter for general adult medical examination without abnormal findings: Secondary | ICD-10-CM

## 2021-06-01 NOTE — Progress Notes (Signed)
I connected with  Hunter Sciara Sr. on 06/01/21 by a audio enabled telemedicine application and verified that I am speaking with the correct person using two identifiers.  Patient Location: Home  Provider Location: Office/Clinic  I discussed the limitations of evaluation and management by telemedicine. The patient expressed understanding and agreed to proceed.  Subjective:   Hunter LESCH Sr. is a 71 y.o. male who presents for Medicare Annual/Subsequent preventive examination.  Review of Systems         Objective:    There were no vitals filed for this visit. There is no height or weight on file to calculate BMI.  Advanced Directives 03/03/2015 09/29/2014 09/26/2014  Does Patient Have a Medical Advance Directive? Yes Yes No  Type of Paramedic of [place];Living will Living will -  Does patient want to make changes to medical advance directive? - No - Patient declined -  Copy of [place] in Chart? No - copy requested No - copy requested -  Would patient like information on creating a medical advance directive? - - No - patient declined information    Current Medications (verified) Outpatient Encounter Medications as of 06/01/2021  Medication Sig   amLODipine (NORVASC) 10 MG tablet Take 1 tablet (10 mg total) by mouth daily.   aspirin 81 MG tablet Take 81 mg by mouth daily.   atorvastatin (LIPITOR) 40 MG tablet Take 1 tablet by mouth once daily.   carvedilol (COREG) 6.25 MG tablet Take 1 tablet (6.25 mg total) by mouth 2 (two) times daily with a meal.   diphenhydrAMINE (BENADRYL) 25 MG tablet Take 25-50 mg by mouth at bedtime as needed for sleep.    fenofibrate 160 MG tablet Take 1 tablet (160 mg total) by mouth at bedtime.   omeprazole (PRILOSEC) 20 MG capsule Take 20 mg by mouth daily as needed (for acid reflux).   No facility-administered encounter medications on file as of 06/01/2021.    Allergies (verified) Patient has no known  allergies.   History: Past Medical History:  Diagnosis Date   Gall bladder disease 09/29/2014   Gout    Hypercholesteremia    Hypertension    Impaired glucose metabolism    PUD (peptic ulcer disease)    Reflux    Renal disorder    1 kidney that is working at 45 %.   Past Surgical History:  Procedure Laterality Date   ABDOMINAL SURGERY     CHOLECYSTECTOMY N/A 09/30/2014   Procedure: LAPAROSCOPIC CHOLECYSTECTOMY WITH INTRAOPERATIVE CHOLANGIOGRAM;  Surgeon: Alphonsa Overall, MD;  Location: [facility];  Service: General;  Laterality: N/A;   COLONOSCOPY N/A 03/03/2015   Procedure: COLONOSCOPY;  Surgeon: Rogene Houston, MD;  [facility];  Service: Endoscopy;  Laterality: N/A;  37   Family History  Problem Relation Age of Onset   Diabetes Mother    Heart attack Mother    Hypertension Sister    Social History   Socioeconomic History   Marital status: Married    Spouse name: Not on file   Number of children: Not on file   Years of education: Not on file   Highest education level: Not on file  Occupational History   Not on file  Tobacco Use   Smoking status: Former    Types: Cigarettes    Quit date: 08/01/1996    Years since quitting: 24.8   Smokeless tobacco: Never  Substance and Sexual Activity   Alcohol use: No  Drug use: No   Sexual activity: Not on file  Other Topics Concern   Not on file  Social History Narrative   Not on file   Social Determinants of Health   Financial Resource Strain: Not on file  Food Insecurity: Not on file  Transportation Needs: Not on file  Physical Activity: Not on file  Stress: Not on file  Social Connections: Not on file    Tobacco Counseling Counseling given: Not Answered   Clinical Intake:     Diabetic?No         Activities of Daily Living No flowsheet data found.  Patient Care Team: Lindell Spar, MD as PCP - General (Internal Medicine)  Indicate any recent Medical Services you may have received from  other than [facility] providers in the past year (date may be approximate).     Assessment:   This is a routine wellness examination for Hunter Franklin.  Hearing/Vision screen No results found.  Dietary issues and exercise activities discussed:     Goals Addressed   None   Depression Screen PHQ 2/9 Scores 05/25/2021 11/04/2020 10/14/2019 06/17/2017 06/13/2016  PHQ - 2 Score 0 0 0 0 0    Fall Risk Fall Risk  05/25/2021 11/04/2020 05/05/2020 03/11/2019 06/17/2017  Falls in the past year? 0 0 0 1 No  Comment - - - Emmi Telephone Survey: data to providers prior to load -  Number falls in past yr: 0 0 - 1 -  Comment - - - Emmi Telephone Survey Actual Response = 9 -  Injury with Fall? 0 0 - 0 -  Risk for fall due to : No Fall Risks No Fall Risks - - -  Follow up Falls evaluation completed Falls evaluation completed Falls evaluation completed - -    FALL RISK PREVENTION PERTAINING TO THE HOME:  Any stairs in or around the home? Yes  If so, are there any without handrails? No  Home free of loose throw rugs in walkways, pet beds, electrical cords, etc? No  Adequate lighting in your home to reduce risk of falls? Yes   ASSISTIVE DEVICES UTILIZED TO PREVENT FALLS:  Life alert? No  Use of a cane, walker or w/c? No  Grab bars in the bathroom? No  Shower chair or bench in shower? No  Elevated toilet seat or a handicapped toilet? No   TIMED UP AND GO:  Was the test performed? No .     Cognitive Function:        Immunizations Immunization History  Administered Date(s) Administered   Influenza Split 02/23/2013   Pneumococcal Conjugate-13 06/19/2018   Pneumococcal Polysaccharide-23 08/01/2012, 05/25/2021   Td 05/19/2011   Zoster Recombinat (Shingrix) 05/25/2021    TDAP status: Due, Education has been provided regarding the importance of this vaccine. Advised may receive this vaccine at local pharmacy or Health Dept. Aware to provide a copy of the vaccination record if obtained from local pharmacy  or Health Dept. Verbalized acceptance and understanding.  Flu Vaccine status: Declined, Education has been provided regarding the importance of this vaccine but patient still declined. Advised may receive this vaccine at local pharmacy or Health Dept. Aware to provide a copy of the vaccination record if obtained from local pharmacy or Health Dept. Verbalized acceptance and understanding.  Pneumococcal vaccine status: Up to date  Covid-19 vaccine status: Declined, Education has been provided regarding the importance of this vaccine but patient still declined. Advised may receive this vaccine at local pharmacy or Health Dept.or  vaccine clinic. Aware to provide a copy of the vaccination record if obtained from local pharmacy or Health Dept. Verbalized acceptance and understanding.  Qualifies for Shingles Vaccine? Yes   Zostavax completed Yes   Shingrix Completed?: Yes  Screening Tests Health Maintenance  Topic Date Due   URINE MICROALBUMIN  06/22/2017   OPHTHALMOLOGY EXAM  04/01/2020   TETANUS/TDAP  05/18/2021   Zoster Vaccines- Shingrix (2 of 2) 07/20/2021   HEMOGLOBIN A1C  11/22/2021   FOOT EXAM  05/25/2022   COLONOSCOPY (Pts 45-90yrs Insurance coverage will need to be confirmed)  03/02/2025   Pneumonia Vaccine 20+ Years old  Completed   Hepatitis C Screening  Completed   HPV VACCINES  Aged Out   INFLUENZA VACCINE  Discontinued   COVID-19 Vaccine  Discontinued    Health Maintenance  Health Maintenance Due  Topic Date Due   URINE MICROALBUMIN  06/22/2017   OPHTHALMOLOGY EXAM  04/01/2020   TETANUS/TDAP  05/18/2021    Colorectal cancer screening: Type of screening: Colonoscopy. Completed 2016. Repeat every 10 years  Lung Cancer Screening: (Low Dose CT Chest recommended if Age 44-80 years, 30 pack-year currently smoking OR have quit w/in 15years.) does not qualify.   Lung Cancer Screening Referral: N/A  Additional Screening:  Hepatitis C Screening: does qualify; Completed  09/27/2014  Vision Screening: Recommended annual ophthalmology exams for early detection of glaucoma and other disorders of the eye. Is the patient up to date with their annual eye exam?  Yes  Who is the provider or what is the name of the office in which the patient attends annual eye exams? Dr. Helmut Muster  If pt is not established with a provider, would they like to be referred to a provider to establish care? No .   Dental Screening: Recommended annual dental exams for proper oral hygiene  Community Resource Referral / Chronic Care Management: CRR required this visit?  No   CCM required this visit?  No      Plan:     I have personally reviewed and noted the following in the patients chart:   Medical and social history Use of alcohol, tobacco or illicit drugs  Current medications and supplements including opioid prescriptions. Patient is not currently taking opioid prescriptions. Functional ability and status Nutritional status Physical activity Advanced directives List of other physicians Hospitalizations, surgeries, and ER visits in previous 12 months Vitals Screenings to include cognitive, depression, and falls Referrals and appointments  In addition, I have reviewed and discussed with patient certain preventive protocols, quality metrics, and best practice recommendations. A written personalized care plan for preventive services as well as general preventive health recommendations were provided to patient.     Hunter Franklin, CMA   06/01/2021   Nurse Notes:  Hunter Franklin , Thank you for taking time to come for your Medicare Wellness Visit. I appreciate your ongoing commitment to your health goals. Please review the following plan we discussed and let me know if I can assist you in the future.   These are the goals we discussed:  Goals   None     This is a list of the screening recommended for you and due dates:  Health Maintenance  Topic Date Due   Urine  Protein Check  06/22/2017   Eye exam for diabetics  04/01/2020   Tetanus Vaccine  05/18/2021   Zoster (Shingles) Vaccine (2 of 2) 07/20/2021   Hemoglobin A1C  11/22/2021   Complete foot exam   05/25/2022  Colon Cancer Screening  03/02/2025   Pneumonia Vaccine  Completed   Hepatitis C Screening: USPSTF Recommendation to screen - Ages 84-79 yo.  Completed   HPV Vaccine  Aged Out   Flu Shot  Discontinued   COVID-19 Vaccine  Discontinued

## 2021-06-01 NOTE — Patient Instructions (Signed)
°  Mr. Hunter Franklin , Thank you for taking time to come for your Medicare Wellness Visit. I appreciate your ongoing commitment to your health goals. Please review the following plan we discussed and let me know if I can assist you in the future.   These are the goals we discussed:  Goals   None     This is a list of the screening recommended for you and due dates:  Health Maintenance  Topic Date Due   Urine Protein Check  06/22/2017   Eye exam for diabetics  04/01/2020   Tetanus Vaccine  05/18/2021   Zoster (Shingles) Vaccine (2 of 2) 07/20/2021   Hemoglobin A1C  11/22/2021   Complete foot exam   05/25/2022   Colon Cancer Screening  03/02/2025   Pneumonia Vaccine  Completed   Hepatitis C Screening: USPSTF Recommendation to screen - Ages 3-79 yo.  Completed   HPV Vaccine  Aged Out   Flu Shot  Discontinued   COVID-19 Vaccine  Discontinued

## 2021-06-08 ENCOUNTER — Encounter: Payer: Self-pay | Admitting: *Deleted

## 2021-06-08 DIAGNOSIS — Z2821 Immunization not carried out because of patient refusal: Secondary | ICD-10-CM | POA: Insufficient documentation

## 2021-07-03 DIAGNOSIS — N1832 Chronic kidney disease, stage 3b: Secondary | ICD-10-CM | POA: Diagnosis not present

## 2021-07-10 DIAGNOSIS — D631 Anemia in chronic kidney disease: Secondary | ICD-10-CM | POA: Diagnosis not present

## 2021-07-10 DIAGNOSIS — I129 Hypertensive chronic kidney disease with stage 1 through stage 4 chronic kidney disease, or unspecified chronic kidney disease: Secondary | ICD-10-CM | POA: Diagnosis not present

## 2021-07-10 DIAGNOSIS — N1832 Chronic kidney disease, stage 3b: Secondary | ICD-10-CM | POA: Diagnosis not present

## 2021-07-10 DIAGNOSIS — N2581 Secondary hyperparathyroidism of renal origin: Secondary | ICD-10-CM | POA: Diagnosis not present

## 2021-08-21 ENCOUNTER — Ambulatory Visit: Payer: PPO | Admitting: Internal Medicine

## 2021-10-04 ENCOUNTER — Ambulatory Visit (HOSPITAL_COMMUNITY)
Admission: RE | Admit: 2021-10-04 | Discharge: 2021-10-04 | Disposition: A | Discharge status: Home / Self Care | Payer: PPO | Source: Ambulatory Visit | Attending: Internal Medicine | Admitting: Internal Medicine

## 2021-10-04 DIAGNOSIS — R011 Cardiac murmur, unspecified: Secondary | ICD-10-CM | POA: Diagnosis not present

## 2021-10-04 LAB — ECHOCARDIOGRAM COMPLETE
AR max vel: 1.45 cm2
AV Area VTI: 1.48 cm2
AV Area mean vel: 1.55 cm2
AV Mean grad: 12 mmHg
AV Peak grad: 21.7 mmHg
Ao pk vel: 2.33 m/s
Area-P 1/2: 3.21 cm2
MV M vel: 5.28 m/s
MV Peak grad: 111.5 mmHg
P 1/2 time: 659 msec
Radius: 0.3 cm
S' Lateral: 3.6 cm

## 2021-10-04 NOTE — Progress Notes (Signed)
*  PRELIMINARY RESULTS* Echocardiogram 2D Echocardiogram has been performed.  Hunter Franklin 10/04/2021, 9:24 AM

## 2021-11-13 DIAGNOSIS — N1832 Chronic kidney disease, stage 3b: Secondary | ICD-10-CM | POA: Diagnosis not present

## 2021-11-13 DIAGNOSIS — N2581 Secondary hyperparathyroidism of renal origin: Secondary | ICD-10-CM | POA: Diagnosis not present

## 2021-11-13 DIAGNOSIS — N189 Chronic kidney disease, unspecified: Secondary | ICD-10-CM | POA: Diagnosis not present

## 2021-11-20 DIAGNOSIS — N2581 Secondary hyperparathyroidism of renal origin: Secondary | ICD-10-CM | POA: Diagnosis not present

## 2021-11-20 DIAGNOSIS — D631 Anemia in chronic kidney disease: Secondary | ICD-10-CM | POA: Diagnosis not present

## 2021-11-20 DIAGNOSIS — I129 Hypertensive chronic kidney disease with stage 1 through stage 4 chronic kidney disease, or unspecified chronic kidney disease: Secondary | ICD-10-CM | POA: Diagnosis not present

## 2021-11-20 DIAGNOSIS — N1832 Chronic kidney disease, stage 3b: Secondary | ICD-10-CM | POA: Diagnosis not present

## 2021-11-20 DIAGNOSIS — M109 Gout, unspecified: Secondary | ICD-10-CM | POA: Diagnosis not present

## 2021-11-22 ENCOUNTER — Encounter: Payer: Self-pay | Admitting: Internal Medicine

## 2021-11-22 ENCOUNTER — Ambulatory Visit (INDEPENDENT_AMBULATORY_CARE_PROVIDER_SITE_OTHER): Payer: PPO | Admitting: Internal Medicine

## 2021-11-22 VITALS — BP 108/62 | HR 52 | Resp 18 | Ht 67.0 in | Wt 173.6 lb

## 2021-11-22 DIAGNOSIS — N183 Chronic kidney disease, stage 3 unspecified: Secondary | ICD-10-CM

## 2021-11-22 DIAGNOSIS — Z0001 Encounter for general adult medical examination with abnormal findings: Secondary | ICD-10-CM | POA: Diagnosis not present

## 2021-11-22 DIAGNOSIS — Z23 Encounter for immunization: Secondary | ICD-10-CM

## 2021-11-22 DIAGNOSIS — I10 Essential (primary) hypertension: Secondary | ICD-10-CM

## 2021-11-22 DIAGNOSIS — N1832 Chronic kidney disease, stage 3b: Secondary | ICD-10-CM

## 2021-11-22 DIAGNOSIS — E785 Hyperlipidemia, unspecified: Secondary | ICD-10-CM | POA: Diagnosis not present

## 2021-11-22 DIAGNOSIS — E1122 Type 2 diabetes mellitus with diabetic chronic kidney disease: Secondary | ICD-10-CM | POA: Diagnosis not present

## 2021-11-22 DIAGNOSIS — I350 Nonrheumatic aortic (valve) stenosis: Secondary | ICD-10-CM | POA: Diagnosis not present

## 2021-11-22 LAB — POCT GLYCOSYLATED HEMOGLOBIN (HGB A1C)
HbA1c POC (<> result, manual entry): 6.2 % (ref 4.0–5.6)
HbA1c, POC (controlled diabetic range): 6.2 % (ref 0.0–7.0)
HbA1c, POC (prediabetic range): 6.2 % (ref 5.7–6.4)

## 2021-11-22 MED ORDER — SODIUM BICARBONATE 650 MG PO TABS: 650.0000 mg | ORAL_TABLET | Freq: Two times a day (BID) | ORAL | 2 refills | Status: DC

## 2021-11-22 NOTE — Progress Notes (Signed)
Established Patient Office Visit  Subjective:  Patient ID: Hunter Goodbar., male    DOB: [birth year]  Age: 71 y.o. MRN: [id number]  CC:  Chief Complaint  Patient presents with   Annual Exam    Annual exam     HPI Hunter MERRIOTT Sr. is a 71 y.o. male with past medical history of HTN, type II DM, CKD 3 and HLD who presents for annual physical.  HTN: BP is well-controlled. Takes medications regularly. Patient denies headache, dizziness, chest pain, dyspnea or palpitations.   Type II DM: Diet controlled.  His HbA1c was 6.5 in the office today.  He denies any polyuria or polydipsia currently.  He strictly follows low-carb diet and does not want to take any medication for it currently.   CKD stage III: He follows up with Dr. Posey Pronto in [place].  He denies any dysuria or hematuria currently.  He reports history of undescended testis and having renal anomaly due to it.    Past Medical History:  Diagnosis Date   Gall bladder disease 09/29/2014   Gout    Hypercholesteremia    Hypertension    Impaired glucose metabolism    PUD (peptic ulcer disease)    Reflux    Renal disorder    1 kidney that is working at 45 %.    Past Surgical History:  Procedure Laterality Date   ABDOMINAL SURGERY     CHOLECYSTECTOMY N/A 09/30/2014   Procedure: LAPAROSCOPIC CHOLECYSTECTOMY WITH INTRAOPERATIVE CHOLANGIOGRAM;  Surgeon: Alphonsa Overall, MD;  Location: [facility];  Service: General;  Laterality: N/A;   COLONOSCOPY N/A 03/03/2015   Procedure: COLONOSCOPY;  Surgeon: Rogene Houston, MD;  Location: [facility];  Service: Endoscopy;  Laterality: N/A;  9    Family History  Problem Relation Age of Onset   Diabetes Mother    Heart attack Mother    Hypertension Sister     Social History   Socioeconomic History   Marital status: Married    Spouse name: Not on file   Number of children: Not on file   Years of education: Not on file   Highest education level: Not on file  Occupational History   Not  on file  Tobacco Use   Smoking status: Former    Types: Cigarettes    Quit date: 08/01/1996    Years since quitting: 25.3   Smokeless tobacco: Never  Vaping Use   Vaping Use: Never used  Substance and Sexual Activity   Alcohol use: No   Drug use: No   Sexual activity: Yes  Other Topics Concern   Not on file  Social History Narrative   Not on file   Social Determinants of Health   Financial Resource Strain: Unknown (06/01/2021)   Overall Financial Resource Strain (CARDIA)    Difficulty of Paying Living Expenses: Patient refused  Food Insecurity: No Food Insecurity (06/01/2021)   Hunger Vital Sign    Worried About Running Out of Food in the Last Year: Never true    Ran Out of Food in the Last Year: Never true  Transportation Needs: No Transportation Needs (06/01/2021)   PRAPARE - Hydrologist (Medical): No    Lack of Transportation (Non-Medical): No  Physical Activity: Sufficiently Active (06/01/2021)   Exercise Vital Sign    Days of Exercise per Week: 5 days    Minutes of Exercise per Session: 60 min  Stress: No Stress Concern Present (06/01/2021)   [employer]  of Occupational Health - Occupational Stress Questionnaire    Feeling of Stress : Not at all  Social Connections: Moderately Isolated (06/01/2021)   Social Connection and Isolation Panel [NHANES]    Frequency of Communication with Friends and Family: Never    Frequency of Social Gatherings with Friends and Family: Never    Attends Religious Services: More than 4 times per year    Active Member of [employer] or Organizations: No    Attends Archivist Meetings: Never    Marital Status: Married  Human resources officer Violence: Not At Risk (06/01/2021)   Humiliation, Afraid, Rape, and Kick questionnaire    Fear of Current or Ex-Partner: No    Emotionally Abused: No    Physically Abused: No    Sexually Abused: No    Outpatient Medications Prior to Visit  Medication Sig Dispense Refill    amLODipine (NORVASC) 10 MG tablet Take 1 tablet (10 mg total) by mouth daily. 90 tablet 1   aspirin 81 MG tablet Take 81 mg by mouth daily.     atorvastatin (LIPITOR) 40 MG tablet Take 1 tablet by mouth once daily. 90 tablet 1   carvedilol (COREG) 6.25 MG tablet Take 1 tablet (6.25 mg total) by mouth 2 (two) times daily with a meal. 180 tablet 1   diphenhydrAMINE (BENADRYL) 25 MG tablet Take 25-50 mg by mouth at bedtime as needed for sleep.      fenofibrate 160 MG tablet Take 1 tablet (160 mg total) by mouth at bedtime. 90 tablet 1   omeprazole (PRILOSEC) 20 MG capsule Take 20 mg by mouth daily as needed (for acid reflux).     No facility-administered medications prior to visit.    No Known Allergies  ROS Review of Systems  Constitutional:  Negative for chills and fever.  HENT:  Negative for congestion and sore throat.   Eyes:  Negative for pain and discharge.  Respiratory:  Negative for cough and shortness of breath.   Cardiovascular:  Negative for chest pain and palpitations.  Gastrointestinal:  Negative for constipation, diarrhea, nausea and vomiting.  Endocrine: Negative for polydipsia and polyuria.  Genitourinary:  Negative for dysuria and hematuria.  Musculoskeletal:  Negative for neck pain and neck stiffness.  Skin:  Negative for rash.  Neurological:  Negative for dizziness, weakness, numbness and headaches.  Psychiatric/Behavioral:  Negative for agitation and behavioral problems.       Objective:    Physical Exam Vitals reviewed.  Constitutional:      General: He is not in acute distress.    Appearance: He is not diaphoretic.  HENT:     Head: Normocephalic and atraumatic.     Nose: Nose normal.     Mouth/Throat:     Mouth: Mucous membranes are moist.  Eyes:     General: No scleral icterus.    Extraocular Movements: Extraocular movements intact.  Cardiovascular:     Rate and Rhythm: Normal rate and regular rhythm.     Pulses: Normal pulses.     Heart sounds:  Murmur (Systolic, over sternal border) heard.  Pulmonary:     Breath sounds: Normal breath sounds. No wheezing or rales.  Abdominal:     Palpations: Abdomen is soft.     Tenderness: There is no abdominal tenderness.  Musculoskeletal:     Cervical back: Neck supple. No tenderness.     Right lower leg: No edema.     Left lower leg: No edema.  Skin:    General: Skin is  warm.     Findings: No rash.  Neurological:     General: No focal deficit present.     Mental Status: He is alert and oriented to person, place, and time.     Sensory: No sensory deficit.     Motor: No weakness.  Psychiatric:        Mood and Affect: Mood normal.        Behavior: Behavior normal.     BP 108/62 (BP Location: Left Arm, Patient Position: Sitting, Cuff Size: Normal)   Pulse (!) 52   Resp 18   Ht _0  (1.702 m)   Wt 173 lb 9.6 oz (78.7 kg)   SpO2 96%   BMI 27.19 kg/m  Wt Readings from Last 3 Encounters:  11/22/21 173 lb 9.6 oz (78.7 kg)  06/01/21 181 lb (82.1 kg)  05/25/21 181 lb 1.9 oz (82.2 kg)    No results found for: "TSH" Lab Results  Component Value Date   WBC 5.7 07/20/2020   HGB 13.9 07/20/2020   HCT 42.6 07/20/2020   MCV 90 07/20/2020   PLT 172 07/20/2020   Lab Results  Component Value Date   NA 142 11/04/2020   K 4.6 11/04/2020   CO2 21 11/04/2020   GLUCOSE 135 (H) 11/04/2020   BUN 26 11/04/2020   CREATININE 1.76 (H) 11/04/2020   BILITOT 0.6 11/04/2020   ALKPHOS 50 11/04/2020   AST 32 11/04/2020   ALT 23 11/04/2020   PROT 6.9 11/04/2020   ALBUMIN 4.5 11/04/2020   CALCIUM 10.1 11/04/2020   ANIONGAP 11 10/02/2014   EGFR 41 (L) 11/04/2020   Lab Results  Component Value Date   CHOL 152 11/04/2020   Lab Results  Component Value Date   HDL 36 (L) 11/04/2020   Lab Results  Component Value Date   LDLCALC 93 11/04/2020   Lab Results  Component Value Date   TRIG 127 11/04/2020   Lab Results  Component Value Date   CHOLHDL 4.2 11/04/2020   Lab Results   Component Value Date   HGBA1C 6.5 05/25/2021      Assessment & Plan:   Problem List Items Addressed This Visit       Cardiovascular and Mediastinum   HTN (hypertension)     Genitourinary   Chronic renal failure, stage 3 (moderate) (HCC)   Relevant Medications   sodium bicarbonate 650 MG tablet   Other Visit Diagnoses     Encounter for general adult medical examination with abnormal findings    -  Primary       Meds ordered this encounter  Medications   sodium bicarbonate 650 MG tablet    Sig: Take 1 tablet (650 mg total) by mouth 2 (two) times daily.    Dispense:  60 tablet    Refill:  2    Follow-up: Return in about 6 months (around 05/25/2022) for DM and CKD.    Lindell Spar, MD

## 2021-11-22 NOTE — Assessment & Plan Note (Signed)
Followed by Dr. Kanna Dafoe-nephrology Avoid nephrotoxic agents including NSAIDs Refilled Sod bicarb for now

## 2021-11-22 NOTE — Assessment & Plan Note (Signed)
On statin and fenofibrate currently We will check lipid profile in the next visit

## 2021-11-22 NOTE — Assessment & Plan Note (Signed)
Has systolic murmur Echo showed mild AS Asymptomatic currently 

## 2021-11-22 NOTE — Assessment & Plan Note (Signed)

## 2021-11-22 NOTE — Patient Instructions (Signed)
Please continue to take medications as prescribed.  Please continue to follow low carb diet and ambulate as tolerated. 

## 2021-11-22 NOTE — Assessment & Plan Note (Signed)
Lab Results  Component Value Date   HGBA1C 6.5 05/25/2021    Diet controlled Advised to follow diabetic diet On statin F/u CMP and lipid panel Diabetic eye exam: Advised to follow up with Ophthalmology for diabetic eye exam

## 2021-11-22 NOTE — Assessment & Plan Note (Signed)
BP Readings from Last 1 Encounters:  11/22/21 108/62   Well-controlled with amlodipine and Coreg Counseled for compliance with the medications Advised DASH diet and moderate exercise/walking as tolerated

## 2021-12-07 DIAGNOSIS — Z08 Encounter for follow-up examination after completed treatment for malignant neoplasm: Secondary | ICD-10-CM | POA: Diagnosis not present

## 2021-12-07 DIAGNOSIS — Z85828 Personal history of other malignant neoplasm of skin: Secondary | ICD-10-CM | POA: Diagnosis not present

## 2021-12-08 ENCOUNTER — Other Ambulatory Visit: Payer: Self-pay | Admitting: Internal Medicine

## 2021-12-08 DIAGNOSIS — I10 Essential (primary) hypertension: Secondary | ICD-10-CM

## 2021-12-10 ENCOUNTER — Other Ambulatory Visit: Payer: Self-pay | Admitting: Internal Medicine

## 2021-12-10 DIAGNOSIS — I10 Essential (primary) hypertension: Secondary | ICD-10-CM

## 2022-02-06 ENCOUNTER — Encounter (INDEPENDENT_AMBULATORY_CARE_PROVIDER_SITE_OTHER): Payer: Self-pay | Admitting: *Deleted

## 2022-02-28 ENCOUNTER — Telehealth: Payer: Self-pay | Admitting: *Deleted

## 2022-02-28 NOTE — Telephone Encounter (Signed)
  Procedure: recall colonoscopy  Has patient had this procedure before?  2016, Dr. Laural Golden  If so, when, by whom and where?    Is there a family history of colon cancer?  no  Who?  What age when diagnosed?    Is patient diabetic? If yes, Type 1 or Type 2   type 2      Does patient have prosthetic heart valve or mechanical valve?  no  Do you have a pacemaker/defibrillator?  no  Has patient ever had endocarditis/atrial fibrillation? no  Does patient use oxygen? no  Has patient had joint replacement within last 12 months?  no  Is patient constipated or do they take laxatives? no  Does patient have a history of alcohol/drug use?  no  Have you had a stroke/heart attack last 6 mths? no  Do you take medicine for weight loss?  no  Is patient on blood thinner such as Coumadin, Plavix and/or Aspirin? Aspirin '81mg'$   Medications:  Current Outpatient Medications on File Prior to Visit  Medication Sig Dispense Refill   amLODipine (NORVASC) 10 MG tablet Take 1 tablet by mouth once daily 90 tablet 0   aspirin 81 MG tablet Take 81 mg by mouth daily.     atorvastatin (LIPITOR) 40 MG tablet Take 1 tablet by mouth once daily. 90 tablet 1   carvedilol (COREG) 6.25 MG tablet TAKE 1 TABLET BY MOUTH TWICE DAILY WITH A MEAL 180 tablet 1   diphenhydrAMINE (BENADRYL) 25 MG tablet Take 25-50 mg by mouth at bedtime as needed for sleep.      fenofibrate 160 MG tablet Take 1 tablet (160 mg total) by mouth at bedtime. 90 tablet 1   omeprazole (PRILOSEC) 20 MG capsule Take 20 mg by mouth daily as needed (for acid reflux).     sodium bicarbonate 650 MG tablet Take 1 tablet (650 mg total) by mouth 2 (two) times daily. 60 tablet 2   No current facility-administered medications on file prior to visit.     Allergies: No Known Allergies

## 2022-02-28 NOTE — Telephone Encounter (Signed)
Room 1

## 2022-03-01 MED ORDER — PEG 3350-KCL-NA BICARB-NACL 420 G PO SOLR: 4000.0000 mL | Freq: Once | ORAL | 0 refills | Status: AC

## 2022-03-01 NOTE — Addendum Note (Signed)
Addended by: Cheron Every on: 03/01/2022 10:04 AM   Modules accepted: Orders

## 2022-03-01 NOTE — Telephone Encounter (Signed)
Called pt. Scheduled for 11/22 at 12:45pm. Aware rx will be sent to pharmacy. He will come by office and pick up instructions

## 2022-03-05 NOTE — Telephone Encounter (Signed)
Triage from recall list, no referral needed

## 2022-03-07 ENCOUNTER — Ambulatory Visit (HOSPITAL_BASED_OUTPATIENT_CLINIC_OR_DEPARTMENT_OTHER): Payer: PPO | Admitting: Anesthesiology

## 2022-03-07 ENCOUNTER — Ambulatory Visit (HOSPITAL_COMMUNITY)
Admission: RE | Admit: 2022-03-07 | Discharge: 2022-03-07 | Disposition: A | Discharge status: Home / Self Care | Payer: PPO | Source: Ambulatory Visit | Attending: Gastroenterology | Admitting: Gastroenterology

## 2022-03-07 ENCOUNTER — Other Ambulatory Visit: Payer: Self-pay

## 2022-03-07 ENCOUNTER — Encounter (HOSPITAL_COMMUNITY)
Admission: RE | Disposition: A | Discharge status: Home / Self Care | Payer: Self-pay | Source: Ambulatory Visit | Attending: Gastroenterology

## 2022-03-07 ENCOUNTER — Encounter (HOSPITAL_COMMUNITY): Payer: Self-pay | Admitting: Gastroenterology

## 2022-03-07 ENCOUNTER — Ambulatory Visit (HOSPITAL_COMMUNITY): Payer: PPO | Admitting: Anesthesiology

## 2022-03-07 DIAGNOSIS — E785 Hyperlipidemia, unspecified: Secondary | ICD-10-CM | POA: Diagnosis not present

## 2022-03-07 DIAGNOSIS — K635 Polyp of colon: Secondary | ICD-10-CM

## 2022-03-07 DIAGNOSIS — K5730 Diverticulosis of large intestine without perforation or abscess without bleeding: Secondary | ICD-10-CM | POA: Insufficient documentation

## 2022-03-07 DIAGNOSIS — Z860100 Personal history of colon polyps, unspecified: Secondary | ICD-10-CM

## 2022-03-07 DIAGNOSIS — Z09 Encounter for follow-up examination after completed treatment for conditions other than malignant neoplasm: Secondary | ICD-10-CM

## 2022-03-07 DIAGNOSIS — E119 Type 2 diabetes mellitus without complications: Secondary | ICD-10-CM | POA: Insufficient documentation

## 2022-03-07 DIAGNOSIS — D123 Benign neoplasm of transverse colon: Secondary | ICD-10-CM | POA: Insufficient documentation

## 2022-03-07 DIAGNOSIS — I10 Essential (primary) hypertension: Secondary | ICD-10-CM | POA: Insufficient documentation

## 2022-03-07 DIAGNOSIS — Z8711 Personal history of peptic ulcer disease: Secondary | ICD-10-CM | POA: Diagnosis not present

## 2022-03-07 DIAGNOSIS — Z1211 Encounter for screening for malignant neoplasm of colon: Secondary | ICD-10-CM | POA: Insufficient documentation

## 2022-03-07 DIAGNOSIS — Z87891 Personal history of nicotine dependence: Secondary | ICD-10-CM | POA: Insufficient documentation

## 2022-03-07 DIAGNOSIS — D122 Benign neoplasm of ascending colon: Secondary | ICD-10-CM | POA: Insufficient documentation

## 2022-03-07 HISTORY — DX: Cardiac murmur, unspecified: R01.1

## 2022-03-07 HISTORY — DX: Type 2 diabetes mellitus without complications: E11.9

## 2022-03-07 HISTORY — PX: POLYPECTOMY: SHX149

## 2022-03-07 HISTORY — PX: COLONOSCOPY WITH PROPOFOL: SHX5780

## 2022-03-07 LAB — HM COLONOSCOPY

## 2022-03-07 SURGERY — COLONOSCOPY WITH PROPOFOL
Anesthesia: General

## 2022-03-07 MED ORDER — PROPOFOL 500 MG/50ML IV EMUL
INTRAVENOUS | Status: DC | PRN
  Administered 2022-03-07: 150 ug/kg/min via INTRAVENOUS

## 2022-03-07 MED ORDER — LIDOCAINE HCL (CARDIAC) PF 100 MG/5ML IV SOSY
PREFILLED_SYRINGE | INTRAVENOUS | Status: DC | PRN
  Administered 2022-03-07: 50 mg via INTRAVENOUS

## 2022-03-07 MED ORDER — PROPOFOL 10 MG/ML IV BOLUS
INTRAVENOUS | Status: DC | PRN
  Administered 2022-03-07: 100 mg via INTRAVENOUS

## 2022-03-07 MED ORDER — EPHEDRINE SULFATE (PRESSORS) 50 MG/ML IJ SOLN
INTRAMUSCULAR | Status: DC | PRN
  Administered 2022-03-07 (×2): 5 mg via INTRAVENOUS

## 2022-03-07 MED ORDER — LACTATED RINGERS IV SOLN: INTRAVENOUS | Status: DC | PRN

## 2022-03-07 MED ORDER — LACTATED RINGERS IV SOLN: INTRAVENOUS | Status: DC

## 2022-03-07 NOTE — Op Note (Addendum)
[facility] Patient Name: Hunter Franklin Procedure Date: 03/07/2022 12:06 PM MRN: [id number] Date of Birth: Jun 23, 1950 Attending MD: Maylon Peppers , , [id number] CSN: [id number] Age: 71 Admit Type: Outpatient Procedure:                Colonoscopy Indications:              Surveillance: Personal history of adenomatous                            polyps on last colonoscopy > 5 years ago Providers:                Maylon Peppers, Janeece Riggers, RN, Aram Candela Referring MD:              Medicines:                Monitored Anesthesia Care Complications:            No immediate complications. Estimated Blood Loss:     Estimated blood loss: none. Procedure:                Pre-Anesthesia Assessment:                           - Prior to the procedure, a History and Physical                            was performed, and patient medications, allergies                            and sensitivities were reviewed. The patient's                            tolerance of previous anesthesia was reviewed.                           - The risks and benefits of the procedure and the                            sedation options and risks were discussed with the                            patient. All questions were answered and informed                            consent was obtained.                           - ASA Grade Assessment: II - A patient with mild                            systemic disease.                           After obtaining informed consent, the colonoscope                            was passed under  direct vision. Throughout the                            procedure, the patient's blood pressure, pulse, and                            oxygen saturations were monitored continuously. The                            [id number]-HQ190L ([id number]) scope was introduced through                            the anus and advanced to the the cecum, identified                            by appendiceal  orifice and ileocecal valve. The                            colonoscopy was performed without difficulty. The                            patient tolerated the procedure well. The quality                            of the bowel preparation was adequate. Scope In: 12:39:51 PM Scope Out: 1:02:15 PM Scope Withdrawal Time: 0 hours 14 minutes 57 seconds  Total Procedure Duration: 0 hours 22 minutes 24 seconds  Findings:      The perianal and digital rectal examinations were normal.      Four sessile polyps were found in the transverse colon and ascending       colon. The polyps were 3 to 6 mm in size. These polyps were removed with       a cold snare. Resection and retrieval were complete.      Scattered small-mouthed diverticula were found in the sigmoid colon.      The retroflexed view of the distal rectum and anal verge was normal and       showed no anal or rectal abnormalities. Impression:               - Four 3 to 6 mm polyps in the transverse colon and                            in the ascending colon, removed with a cold snare.                            Resected and retrieved.                           - Diverticulosis in the sigmoid colon.                           - The distal rectum and anal verge are normal on  retroflexion view. Moderate Sedation:      Per Anesthesia Care Recommendation:           - Discharge patient to home (ambulatory).                           - Resume previous diet.                           - Await pathology results.                           - Repeat colonoscopy for surveillance based on                            pathology results. Procedure Code(s):        --- Professional ---                           [phone], Colonoscopy, flexible; with removal of                            tumor(s), polyp(s), or other lesion(s) by snare                            technique Diagnosis Code(s):        --- Professional ---                            Z86.010, Personal history of colonic polyps                           D12.3, Benign neoplasm of transverse colon (hepatic                            flexure or splenic flexure)                           D12.2, Benign neoplasm of ascending colon                           K57.30, Diverticulosis of large intestine without                            perforation or abscess without bleeding CPT copyright 2022 American Medical Association. All rights reserved. The codes documented in this report are preliminary and upon coder review may  be revised to meet current compliance requirements. Maylon Peppers, MD Maylon Peppers,  03/07/2022 1:08:28 PM This report has been signed electronically. Number of Addenda: 0

## 2022-03-07 NOTE — Anesthesia Preprocedure Evaluation (Signed)
Anesthesia Evaluation  Patient identified by MRN, date of birth, ID band Patient awake    Reviewed: Allergy & Precautions, H&P , NPO status , Patient's Chart, lab work & pertinent test results, reviewed documented beta blocker date and time   Airway Mallampati: II  TM Distance: >3 FB Neck ROM: full    Dental no notable dental hx.    Pulmonary neg pulmonary ROS, former smoker   Pulmonary exam normal breath sounds clear to auscultation       Cardiovascular Exercise Tolerance: Good hypertension, negative cardio ROS + Valvular Problems/Murmurs  Rhythm:regular Rate:Normal     Neuro/Psych negative neurological ROS  negative psych ROS   GI/Hepatic negative GI ROS, Neg liver ROS, PUD,,,  Endo/Other  negative endocrine ROSdiabetes, Type 2    Renal/GU Renal diseasenegative Renal ROS  negative genitourinary   Musculoskeletal   Abdominal   Peds  Hematology negative hematology ROS (+)   Anesthesia Other Findings   Reproductive/Obstetrics negative OB ROS                             Anesthesia Physical Anesthesia Plan  ASA: 2  Anesthesia Plan: General   Post-op Pain Management:    Induction:   PONV Risk Score and Plan: Propofol infusion  Airway Management Planned:   Additional Equipment:   Intra-op Plan:   Post-operative Plan:   Informed Consent: I have reviewed the patients History and Physical, chart, labs and discussed the procedure including the risks, benefits and alternatives for the proposed anesthesia with the patient or authorized representative who has indicated his/her understanding and acceptance.     Dental Advisory Given  Plan Discussed with: CRNA  Anesthesia Plan Comments:        Anesthesia Quick Evaluation

## 2022-03-07 NOTE — Transfer of Care (Signed)
Immediate Anesthesia Transfer of Care Note  Patient: Hunter FRIMPONG Sr.  Procedure(s) Performed: COLONOSCOPY WITH PROPOFOL POLYPECTOMY INTESTINAL  Patient Location: Short Stay  Anesthesia Type:General  Level of Consciousness: awake, alert , oriented, and patient cooperative  Airway & Oxygen Therapy: Patient Spontanous Breathing  Post-op Assessment: Report given to RN, Post -op Vital signs reviewed and stable, and Patient moving all extremities X 4  Post vital signs: Reviewed and stable  Last Vitals:  Vitals Value Taken Time  BP    Temp    Pulse    Resp    SpO2      Last Pain:  Vitals:   03/07/22 1240  TempSrc:   PainSc: 0-No pain      Patients Stated Pain Goal: 8 (68/12/75 1700)  Complications: No notable events documented.

## 2022-03-07 NOTE — H&P (Signed)
Hunter Sciara Sr. is an 71 y.o. male.   Chief Complaint: History of colonic polyps HPI: 71 year old male with past medical history of diabetes, hyperlipidemia, hypertension, ulcer disease, coming for history of adenomatous colon polyps. T last colonoscopy in 2016, was found to have 1 adenomatous polyp.  The patient denies having any complaints such as melena, hematochezia, abdominal pain or distention, change in her bowel movement consistency or frequency, no changes in weight recently.  No family history of colorectal cancer.   Past Medical History:  Diagnosis Date   Diabetes mellitus without complication ([place])    Gall bladder disease 09/29/2014   Gout    Heart murmur    Hypercholesteremia    Hypertension    Impaired glucose metabolism    PUD (peptic ulcer disease)    Reflux    Renal disorder    1 kidney that is working at 45 %.    Past Surgical History:  Procedure Laterality Date   ABDOMINAL SURGERY     CHOLECYSTECTOMY N/A 09/30/2014   Procedure: LAPAROSCOPIC CHOLECYSTECTOMY WITH INTRAOPERATIVE CHOLANGIOGRAM;  Surgeon: Alphonsa Overall, MD;  Location: [facility];  Service: General;  Laterality: N/A;   COLONOSCOPY N/A 03/03/2015   Procedure: COLONOSCOPY;  Surgeon: Rogene Houston, MD;  Location: [facility];  Service: Endoscopy;  Laterality: N/A;  61    Family History  Problem Relation Age of Onset   Diabetes Mother    Heart attack Mother    Hypertension Sister    Social History:  reports that he quit smoking about 25 years ago. His smoking use included cigarettes. He has never used smokeless tobacco. He reports that he does not drink alcohol and does not use drugs.  Allergies: No Known Allergies  Medications Prior to Admission  Medication Sig Dispense Refill   amLODipine (NORVASC) 10 MG tablet Take 1 tablet by mouth once daily 90 tablet 0   aspirin 81 MG tablet Take 81 mg by mouth daily.     atorvastatin (LIPITOR) 40 MG tablet Take 1 tablet by mouth once daily. 90 tablet 1    carvedilol (COREG) 6.25 MG tablet TAKE 1 TABLET BY MOUTH TWICE DAILY WITH A MEAL 180 tablet 1   diphenhydrAMINE (BENADRYL) 25 MG tablet Take 25 mg by mouth at bedtime.     fenofibrate 160 MG tablet Take 1 tablet (160 mg total) by mouth at bedtime. 90 tablet 1   omeprazole (PRILOSEC) 20 MG capsule Take 20 mg by mouth daily as needed (for acid reflux).     Polyvinyl Alcohol-Povidone (REFRESH OP) Place 1 drop into both eyes daily as needed (dry eyes).     sodium bicarbonate 650 MG tablet Take 1 tablet (650 mg total) by mouth 2 (two) times daily. 60 tablet 2    No results found for this or any previous visit (from the past 48 hour(s)). No results found.  Review of Systems  All other systems reviewed and are negative.   Blood pressure (!) 149/73, pulse 68, temperature 97.9 F (36.6 C), temperature source Oral, resp. rate (!) 8, height '5\' 7"'$  (1.702 m), weight 71.2 kg, SpO2 98 %. Physical Exam  GENERAL: The patient is AO x3, in no acute distress. HEENT: Head is normocephalic and atraumatic. EOMI are intact. Mouth is well hydrated and without lesions. NECK: Supple. No masses LUNGS: Clear to auscultation. No presence of rhonchi/wheezing/rales. Adequate chest expansion HEART: RRR, normal s1 and s2. ABDOMEN: Soft, nontender, no guarding, no peritoneal signs, and nondistended. BS +. No masses. EXTREMITIES:  Without any cyanosis, clubbing, rash, lesions or edema. NEUROLOGIC: AOx3, no focal motor deficit. SKIN: no jaundice, no rashes  Assessment/Plan 71 year old male with past medical history of diabetes, hyperlipidemia, hypertension, ulcer disease, coming for history of adenomatous colon polyps we will proceed with colonoscopy.  Harvel Quale, MD 03/07/2022, 12:37 PM

## 2022-03-07 NOTE — Discharge Instructions (Signed)
You are being discharged to home.  Resume your previous diet.  We are waiting for your pathology results.  Your physician has recommended a repeat colonoscopy for surveillance based on pathology results.  

## 2022-03-09 LAB — SURGICAL PATHOLOGY

## 2022-03-09 NOTE — Anesthesia Postprocedure Evaluation (Signed)
Anesthesia Post Note  Patient: Hunter DEGROFF Sr.  Procedure(s) Performed: COLONOSCOPY WITH PROPOFOL POLYPECTOMY INTESTINAL  Patient location during evaluation: Phase II Anesthesia Type: General Level of consciousness: awake Pain management: pain level controlled Vital Signs Assessment: post-procedure vital signs reviewed and stable Respiratory status: spontaneous breathing and respiratory function stable Cardiovascular status: blood pressure returned to baseline and stable Postop Assessment: no headache and no apparent nausea or vomiting Anesthetic complications: no Comments: Late entry   No notable events documented.   Last Vitals:  Vitals:   03/07/22 1128 03/07/22 1306  BP: (!) 149/73 (!) 99/50  Pulse: 68 79  Resp: (!) 8 13  Temp: 36.6 C (!) 36.4 C  SpO2: 98% 98%    Last Pain:  Vitals:   03/07/22 1306  TempSrc: Axillary  PainSc: 0-No pain                 Louann Sjogren

## 2022-03-11 ENCOUNTER — Other Ambulatory Visit: Payer: Self-pay | Admitting: Internal Medicine

## 2022-03-11 DIAGNOSIS — I10 Essential (primary) hypertension: Secondary | ICD-10-CM

## 2022-03-12 ENCOUNTER — Encounter (INDEPENDENT_AMBULATORY_CARE_PROVIDER_SITE_OTHER): Payer: Self-pay | Admitting: *Deleted

## 2022-03-13 DIAGNOSIS — N1832 Chronic kidney disease, stage 3b: Secondary | ICD-10-CM | POA: Diagnosis not present

## 2022-03-15 ENCOUNTER — Encounter (HOSPITAL_COMMUNITY): Payer: Self-pay | Admitting: Gastroenterology

## 2022-03-21 DIAGNOSIS — D631 Anemia in chronic kidney disease: Secondary | ICD-10-CM | POA: Diagnosis not present

## 2022-03-21 DIAGNOSIS — N1832 Chronic kidney disease, stage 3b: Secondary | ICD-10-CM | POA: Diagnosis not present

## 2022-03-21 DIAGNOSIS — I129 Hypertensive chronic kidney disease with stage 1 through stage 4 chronic kidney disease, or unspecified chronic kidney disease: Secondary | ICD-10-CM | POA: Diagnosis not present

## 2022-03-21 DIAGNOSIS — N2581 Secondary hyperparathyroidism of renal origin: Secondary | ICD-10-CM | POA: Diagnosis not present

## 2022-03-27 ENCOUNTER — Other Ambulatory Visit: Payer: Self-pay | Admitting: Internal Medicine

## 2022-03-27 DIAGNOSIS — E785 Hyperlipidemia, unspecified: Secondary | ICD-10-CM

## 2022-04-03 DIAGNOSIS — H524 Presbyopia: Secondary | ICD-10-CM | POA: Diagnosis not present

## 2022-04-03 DIAGNOSIS — H40013 Open angle with borderline findings, low risk, bilateral: Secondary | ICD-10-CM | POA: Diagnosis not present

## 2022-04-03 LAB — HM DIABETES EYE EXAM

## 2022-04-06 ENCOUNTER — Other Ambulatory Visit: Payer: Self-pay | Admitting: Internal Medicine

## 2022-04-06 DIAGNOSIS — E785 Hyperlipidemia, unspecified: Secondary | ICD-10-CM

## 2022-05-29 ENCOUNTER — Encounter: Payer: Self-pay | Admitting: Internal Medicine

## 2022-05-29 ENCOUNTER — Ambulatory Visit (INDEPENDENT_AMBULATORY_CARE_PROVIDER_SITE_OTHER): Payer: PPO | Admitting: Internal Medicine

## 2022-05-29 VITALS — BP 142/84 | HR 88 | Ht 67.0 in | Wt 177.2 lb

## 2022-05-29 DIAGNOSIS — E785 Hyperlipidemia, unspecified: Secondary | ICD-10-CM

## 2022-05-29 DIAGNOSIS — I10 Essential (primary) hypertension: Secondary | ICD-10-CM

## 2022-05-29 DIAGNOSIS — Z2821 Immunization not carried out because of patient refusal: Secondary | ICD-10-CM

## 2022-05-29 DIAGNOSIS — E1122 Type 2 diabetes mellitus with diabetic chronic kidney disease: Secondary | ICD-10-CM

## 2022-05-29 DIAGNOSIS — N2581 Secondary hyperparathyroidism of renal origin: Secondary | ICD-10-CM

## 2022-05-29 DIAGNOSIS — N183 Chronic kidney disease, stage 3 unspecified: Secondary | ICD-10-CM

## 2022-05-29 DIAGNOSIS — N1832 Chronic kidney disease, stage 3b: Secondary | ICD-10-CM | POA: Diagnosis not present

## 2022-05-29 DIAGNOSIS — I350 Nonrheumatic aortic (valve) stenosis: Secondary | ICD-10-CM | POA: Diagnosis not present

## 2022-05-29 MED ORDER — ATORVASTATIN CALCIUM 40 MG PO TABS: 40.0000 mg | ORAL_TABLET | Freq: Every day | ORAL | 3 refills | Status: DC

## 2022-05-29 MED ORDER — CARVEDILOL 12.5 MG PO TABS: 12.5000 mg | ORAL_TABLET | Freq: Two times a day (BID) | ORAL | 1 refills | Status: DC

## 2022-05-29 MED ORDER — FENOFIBRATE 160 MG PO TABS: 160.0000 mg | ORAL_TABLET | Freq: Every day | ORAL | 3 refills | Status: DC

## 2022-05-29 MED ORDER — AMLODIPINE BESYLATE 10 MG PO TABS: 10.0000 mg | ORAL_TABLET | Freq: Every day | ORAL | 3 refills | Status: DC

## 2022-05-29 NOTE — Assessment & Plan Note (Signed)
Lab Results  Component Value Date   HGBA1C 6.2 11/22/2021   HGBA1C 6.2 11/22/2021   HGBA1C 6.2 11/22/2021    Diet controlled Advised to follow diabetic diet On statin F/u CMP and lipid panel Diabetic eye exam: Advised to follow up with Ophthalmology for diabetic eye exam

## 2022-05-29 NOTE — Patient Instructions (Addendum)
Please start taking Carvedilol 12.5 mg twice daily.  Please continue taking other medications as prescribed.  Please continue to follow low salt diet and ambulate as tolerated.  Please consider getting Tdap vaccine at local pharmacy.

## 2022-05-29 NOTE — Progress Notes (Signed)
Established Patient Office Visit  Subjective:  Patient ID: Hunter Hausser., male    DOB: 1951/02/15  Age: 72 y.o. MRN: [id number]  CC:  Chief Complaint  Patient presents with   Diabetes    Follow up   Chronic Kidney Disease    Follow up    HPI Hunter HENRIQUES Sr. is a 73 y.o. male with past medical history of HTN, type II DM, CKD 3 and HLD who presents for f/u of her chronic medical conditions.  HTN: BP is elevated today. Takes amlodipine 10 mg QD and Coreg 6.125 mg BID regularly. Patient denies headache, dizziness, chest pain, dyspnea or palpitations.  Type II DM: Diet controlled.  His HbA1c was 6.2 in 08/23. He denies any polyuria or polydipsia currently.  He strictly follows low-carb diet and does not want to take any medication for it currently.  CKD stage III: He follows up with Dr. Posey Pronto in [place].  He denies any dysuria or hematuria currently.  He reports history of undescended testis and having renal anomaly due to it.  Past Medical History:  Diagnosis Date   Diabetes mellitus without complication ([place])    Gall bladder disease 09/29/2014   Gout    Heart murmur    Hypercholesteremia    Hypertension    Impaired glucose metabolism    PUD (peptic ulcer disease)    Reflux    Renal disorder    1 kidney that is working at 45 %.    Past Surgical History:  Procedure Laterality Date   ABDOMINAL SURGERY     CHOLECYSTECTOMY N/A 09/30/2014   Procedure: LAPAROSCOPIC CHOLECYSTECTOMY WITH INTRAOPERATIVE CHOLANGIOGRAM;  Surgeon: Alphonsa Overall, MD;  Location: [facility];  Service: General;  Laterality: N/A;   COLONOSCOPY N/A 03/03/2015   Procedure: COLONOSCOPY;  Surgeon: Rogene Houston, MD;  Location: [facility];  Service: Endoscopy;  Laterality: N/A;  730   COLONOSCOPY WITH PROPOFOL N/A 03/07/2022   Procedure: COLONOSCOPY WITH PROPOFOL;  Surgeon: Harvel Quale, MD;  Location: [facility];  Service: Gastroenterology;  Laterality: N/A;  12:45PM, ASA 1-2    POLYPECTOMY  03/07/2022   Procedure: POLYPECTOMY INTESTINAL;  Surgeon: Montez Morita, Quillian Quince, MD;  Location: [facility];  Service: Gastroenterology;;    Family History  Problem Relation Age of Onset   Diabetes Mother    Heart attack Mother    Hypertension Sister     Social History   Socioeconomic History   Marital status: Married    Spouse name: Not on file   Number of children: Not on file   Years of education: Not on file   Highest education level: Not on file  Occupational History   Not on file  Tobacco Use   Smoking status: Former    Types: Cigarettes    Quit date: 08/01/1996    Years since quitting: 25.8   Smokeless tobacco: Never  Vaping Use   Vaping Use: Never used  Substance and Sexual Activity   Alcohol use: No   Drug use: No   Sexual activity: Yes  Other Topics Concern   Not on file  Social History Narrative   Not on file   Social Determinants of Health   Financial Resource Strain: Unknown (06/01/2021)   Overall Financial Resource Strain (CARDIA)    Difficulty of Paying Living Expenses: Patient refused  Food Insecurity: No Food Insecurity (06/01/2021)   Hunger Vital Sign    Worried About [place] in the Last Year:  Never true    Ran Out of Food in the Last Year: Never true  Transportation Needs: No Transportation Needs (06/01/2021)   PRAPARE - Hydrologist (Medical): No    Lack of Transportation (Non-Medical): No  Physical Activity: Sufficiently Active (06/01/2021)   Exercise Vital Sign    Days of Exercise per Week: 5 days    Minutes of Exercise per Session: 60 min  Stress: No Stress Concern Present (06/01/2021)   [place]    Feeling of Stress : Not at all  Social Connections: Moderately Isolated (06/01/2021)   Social Connection and Isolation Panel [NHANES]    Frequency of Communication with Friends and Family: Never    Frequency of Social  Gatherings with Friends and Family: Never    Attends Religious Services: More than 4 times per year    Active Member of [employer] or Organizations: No    Attends Archivist Meetings: Never    Marital Status: Married  Human resources officer Violence: Not At Risk (06/01/2021)   Humiliation, Afraid, Rape, and Kick questionnaire    Fear of Current or Ex-Partner: No    Emotionally Abused: No    Physically Abused: No    Sexually Abused: No    Outpatient Medications Prior to Visit  Medication Sig Dispense Refill   aspirin 81 MG tablet Take 81 mg by mouth daily.     diphenhydrAMINE (BENADRYL) 25 MG tablet Take 25 mg by mouth at bedtime.     omeprazole (PRILOSEC) 20 MG capsule Take 20 mg by mouth daily as needed (for acid reflux).     Polyvinyl Alcohol-Povidone (REFRESH OP) Place 1 drop into both eyes daily as needed (dry eyes).     amLODipine (NORVASC) 10 MG tablet Take 1 tablet by mouth once daily 90 tablet 0   atorvastatin (LIPITOR) 40 MG tablet Take 1 tablet by mouth once daily 90 tablet 0   carvedilol (COREG) 6.25 MG tablet TAKE 1 TABLET BY MOUTH TWICE DAILY WITH A MEAL 180 tablet 1   fenofibrate 160 MG tablet TAKE 1 TABLET BY MOUTH AT BEDTIME 90 tablet 0   sodium bicarbonate 650 MG tablet Take 1 tablet (650 mg total) by mouth 2 (two) times daily. (Patient not taking: Reported on 05/29/2022) 60 tablet 2   No facility-administered medications prior to visit.    No Known Allergies  ROS Review of Systems  Constitutional:  Negative for chills and fever.  HENT:  Negative for congestion and sore throat.   Eyes:  Negative for pain and discharge.  Respiratory:  Negative for cough and shortness of breath.   Cardiovascular:  Negative for chest pain and palpitations.  Gastrointestinal:  Negative for constipation, diarrhea, nausea and vomiting.  Endocrine: Negative for polydipsia and polyuria.  Genitourinary:  Negative for dysuria and hematuria.  Musculoskeletal:  Negative for neck pain and  neck stiffness.  Skin:  Negative for rash.  Neurological:  Negative for dizziness, weakness, numbness and headaches.  Psychiatric/Behavioral:  Negative for agitation and behavioral problems.       Objective:    Physical Exam Vitals reviewed.  Constitutional:      General: He is not in acute distress.    Appearance: He is not diaphoretic.  HENT:     Head: Normocephalic and atraumatic.     Nose: Nose normal.     Mouth/Throat:     Mouth: Mucous membranes are moist.  Eyes:     General:  No scleral icterus.    Extraocular Movements: Extraocular movements intact.  Cardiovascular:     Rate and Rhythm: Normal rate and regular rhythm.     Pulses: Normal pulses.     Heart sounds: Murmur (Systolic, over sternal border) heard.  Pulmonary:     Breath sounds: Normal breath sounds. No wheezing or rales.  Abdominal:     Palpations: Abdomen is soft.     Tenderness: There is no abdominal tenderness.  Musculoskeletal:     Cervical back: Neck supple. No tenderness.     Right lower leg: No edema.     Left lower leg: No edema.  Skin:    General: Skin is warm.     Findings: No rash.  Neurological:     General: No focal deficit present.     Mental Status: He is alert and oriented to person, place, and time.     Sensory: No sensory deficit.     Motor: No weakness.  Psychiatric:        Mood and Affect: Mood normal.        Behavior: Behavior normal.     BP (!) 142/84 (BP Location: Left Arm, Cuff Size: Normal)   Pulse 88   Ht 5' 7"$  (1.702 m)   Wt 177 lb 3.2 oz (80.4 kg)   SpO2 96%   BMI 27.75 kg/m  Wt Readings from Last 3 Encounters:  05/29/22 177 lb 3.2 oz (80.4 kg)  03/07/22 157 lb (71.2 kg)  11/22/21 173 lb 9.6 oz (78.7 kg)    No results found for: "TSH" Lab Results  Component Value Date   WBC 5.7 07/20/2020   HGB 13.9 07/20/2020   HCT 42.6 07/20/2020   MCV 90 07/20/2020   PLT 172 07/20/2020   Lab Results  Component Value Date   NA 142 11/04/2020   K 4.6 11/04/2020    CO2 21 11/04/2020   GLUCOSE 135 (H) 11/04/2020   BUN 26 11/04/2020   CREATININE 1.76 (H) 11/04/2020   BILITOT 0.6 11/04/2020   ALKPHOS 50 11/04/2020   AST 32 11/04/2020   ALT 23 11/04/2020   PROT 6.9 11/04/2020   ALBUMIN 4.5 11/04/2020   CALCIUM 10.1 11/04/2020   ANIONGAP 11 10/02/2014   EGFR 41 (L) 11/04/2020   Lab Results  Component Value Date   CHOL 152 11/04/2020   Lab Results  Component Value Date   HDL 36 (L) 11/04/2020   Lab Results  Component Value Date   LDLCALC 93 11/04/2020   Lab Results  Component Value Date   TRIG 127 11/04/2020   Lab Results  Component Value Date   CHOLHDL 4.2 11/04/2020   Lab Results  Component Value Date   HGBA1C 6.2 11/22/2021   HGBA1C 6.2 11/22/2021   HGBA1C 6.2 11/22/2021      Assessment & Plan:   Problem List Items Addressed This Visit       Cardiovascular and Mediastinum   HTN (hypertension) - Primary    BP Readings from Last 1 Encounters:  05/29/22 (!) 157/71  Uncontrolled with amlodipine and Coreg Increased dose of Coreg to 12.5 mg BID Counseled for compliance with the medications Advised DASH diet and moderate exercise/walking as tolerated      Relevant Medications   atorvastatin (LIPITOR) 40 MG tablet   fenofibrate 160 MG tablet   amLODipine (NORVASC) 10 MG tablet   carvedilol (COREG) 12.5 MG tablet   Nonrheumatic aortic valve stenosis    Has systolic murmur Echo showed mild AS  Asymptomatic currently      Relevant Medications   atorvastatin (LIPITOR) 40 MG tablet   fenofibrate 160 MG tablet   amLODipine (NORVASC) 10 MG tablet   carvedilol (COREG) 12.5 MG tablet     Endocrine   Controlled type 2 diabetes mellitus with chronic kidney disease ([place])    Lab Results  Component Value Date   HGBA1C 6.2 11/22/2021   HGBA1C 6.2 11/22/2021   HGBA1C 6.2 11/22/2021   Diet controlled Advised to follow diabetic diet On statin F/u CMP and lipid panel Diabetic eye exam: Advised to follow up with  Ophthalmology for diabetic eye exam      Relevant Medications   atorvastatin (LIPITOR) 40 MG tablet   Other Relevant Orders   Hemoglobin A1c   Urine Microalbumin w/creat. ratio   Secondary hyperparathyroidism of renal origin ([place])    Followed by nephrology Calcium and Ph wnl usually        Genitourinary   Chronic renal failure, stage 3 (moderate) ([facility])    Followed by Dr. Colletta Spillers-nephrology Last BMP showed GFR - 45 Avoid nephrotoxic agents including NSAIDs DC Sod bicarb as he had severe GI symptoms, advised to discuss with Nephrology      Relevant Orders   Basic Metabolic Panel (BMET)   Urine Microalbumin w/creat. ratio     Other   Hyperlipidemia LDL goal <100    On statin and fenofibrate currently We will check lipid profile      Relevant Medications   atorvastatin (LIPITOR) 40 MG tablet   fenofibrate 160 MG tablet   amLODipine (NORVASC) 10 MG tablet   carvedilol (COREG) 12.5 MG tablet   Other Relevant Orders   Lipid Profile   Refused influenza vaccine    Meds ordered this encounter  Medications   atorvastatin (LIPITOR) 40 MG tablet    Sig: Take 1 tablet (40 mg total) by mouth daily.    Dispense:  90 tablet    Refill:  3   fenofibrate 160 MG tablet    Sig: Take 1 tablet (160 mg total) by mouth at bedtime.    Dispense:  90 tablet    Refill:  3   amLODipine (NORVASC) 10 MG tablet    Sig: Take 1 tablet (10 mg total) by mouth daily.    Dispense:  90 tablet    Refill:  3   carvedilol (COREG) 12.5 MG tablet    Sig: Take 1 tablet (12.5 mg total) by mouth 2 (two) times daily with a meal.    Dispense:  180 tablet    Refill:  1    Follow-up: Return in about 3 months (around 08/27/2022) for HTN.    Lindell Spar, MD

## 2022-05-29 NOTE — Assessment & Plan Note (Signed)
Followed by nephrology Calcium and Ph wnl usually

## 2022-05-29 NOTE — Assessment & Plan Note (Addendum)
Followed by Dr. Francena Zender-nephrology Last BMP showed GFR - 45 Avoid nephrotoxic agents including NSAIDs DC Sod bicarb as he had severe GI symptoms, advised to discuss with Nephrology

## 2022-05-29 NOTE — Assessment & Plan Note (Signed)
On statin and fenofibrate currently We will check lipid profile

## 2022-05-29 NOTE — Assessment & Plan Note (Signed)
Has systolic murmur Echo showed mild AS Asymptomatic currently

## 2022-05-29 NOTE — Assessment & Plan Note (Addendum)
BP Readings from Last 1 Encounters:  05/29/22 (!) 157/71   Uncontrolled with amlodipine and Coreg Increased dose of Coreg to 12.5 mg BID Counseled for compliance with the medications Advised DASH diet and moderate exercise/walking as tolerated

## 2022-05-31 LAB — BASIC METABOLIC PANEL
BUN/Creatinine Ratio: 15 (ref 10–24)
BUN: 30 mg/dL — ABNORMAL HIGH (ref 8–27)
CO2: 18 mmol/L — ABNORMAL LOW (ref 20–29)
Calcium: 9.4 mg/dL (ref 8.6–10.2)
Chloride: 101 mmol/L (ref 96–106)
Creatinine, Ser: 2.01 mg/dL — ABNORMAL HIGH (ref 0.76–1.27)
Glucose: 96 mg/dL (ref 70–99)
Potassium: 4.3 mmol/L (ref 3.5–5.2)
Sodium: 140 mmol/L (ref 134–144)
eGFR: 35 mL/min/{1.73_m2} — ABNORMAL LOW (ref >59)

## 2022-05-31 LAB — LIPID PANEL
Chol/HDL Ratio: 3.8 ratio (ref 0.0–5.0)
Cholesterol, Total: 137 mg/dL (ref 100–199)
HDL: 36 mg/dL — ABNORMAL LOW (ref >39)
LDL Chol Calc (NIH): 74 mg/dL (ref 0–99)
Triglycerides: 154 mg/dL — ABNORMAL HIGH (ref 0–149)
VLDL Cholesterol Cal: 27 mg/dL (ref 5–40)

## 2022-05-31 LAB — HEMOGLOBIN A1C
Est. average glucose Bld gHb Est-mCnc: 128 mg/dL
Hgb A1c MFr Bld: 6.1 % — ABNORMAL HIGH (ref 4.8–5.6)

## 2022-05-31 LAB — MICROALBUMIN / CREATININE URINE RATIO
Creatinine, Urine: 19.7 mg/dL
Microalb/Creat Ratio: 82 mg/g creat — ABNORMAL HIGH (ref 0–29)
Microalbumin, Urine: 16.1 ug/mL

## 2022-06-04 ENCOUNTER — Ambulatory Visit (INDEPENDENT_AMBULATORY_CARE_PROVIDER_SITE_OTHER): Payer: PPO

## 2022-06-04 ENCOUNTER — Ambulatory Visit: Payer: PPO

## 2022-06-04 DIAGNOSIS — Z0000 Encounter for general adult medical examination without abnormal findings: Secondary | ICD-10-CM

## 2022-06-04 NOTE — Patient Instructions (Signed)
  Mr. Czaplewski , Thank you for taking time to come for your Medicare Wellness Visit. I appreciate your ongoing commitment to your health goals. Please review the following plan we discussed and let me know if I can assist you in the future.   These are the goals we discussed:  Goals      Patient Stated     Lose about 10 pounds.        This is a list of the screening recommended for you and due dates:  Health Maintenance  Topic Date Due   DTaP/Tdap/Td vaccine (2 - Tdap) 05/18/2021   Eye exam for diabetics  04/04/2022   Flu Shot  07/15/2022*   Hemoglobin A1C  11/27/2022   Yearly kidney function blood test for diabetes  05/30/2023   Yearly kidney health urinalysis for diabetes  05/30/2023   Complete foot exam   05/30/2023   Medicare Annual Wellness Visit  06/05/2023   Colon Cancer Screening  03/08/2027   Pneumonia Vaccine  Completed   Hepatitis C Screening: USPSTF Recommendation to screen - Ages 79-79 yo.  Completed   Zoster (Shingles) Vaccine  Completed   HPV Vaccine  Aged Out   COVID-19 Vaccine  Discontinued  *Topic was postponed. The date shown is not the original due date.

## 2022-06-04 NOTE — Progress Notes (Signed)
Subjective:   Hunter HORNOR Sr. is a 72 y.o. male who presents for Medicare Annual/Subsequent preventive examination.  Review of Systems    I connected with  Hunter HANING Sr. on 06/04/22 by a audio enabled telemedicine application and verified that I am speaking with the correct person using two identifiers.  Patient Location: Home  Provider Location: Office/Clinic  I discussed the limitations of evaluation and management by telemedicine. The patient expressed understanding and agreed to proceed.        Objective:    There were no vitals filed for this visit. There is no height or weight on file to calculate BMI.     03/07/2022   11:18 AM 06/01/2021    2:55 PM 03/03/2015    7:27 AM 09/29/2014    4:52 PM 09/26/2014    7:22 PM  Advanced Directives  Does Patient Have a Medical Advance Directive? No Yes Yes Yes No  Type of Scientist, physiological of [place];Living will Living will   Does patient want to make changes to medical advance directive?  No - Patient declined  No - Patient declined   Copy of [place] in Chart?   No - copy requested No - copy requested   Would patient like information on creating a medical advance directive? No - Patient declined    No - patient declined information    Current Medications (verified) Outpatient Encounter Medications as of 06/04/2022  Medication Sig   amLODipine (NORVASC) 10 MG tablet Take 1 tablet (10 mg total) by mouth daily.   aspirin 81 MG tablet Take 81 mg by mouth daily.   atorvastatin (LIPITOR) 40 MG tablet Take 1 tablet (40 mg total) by mouth daily.   carvedilol (COREG) 12.5 MG tablet Take 1 tablet (12.5 mg total) by mouth 2 (two) times daily with a meal.   diphenhydrAMINE (BENADRYL) 25 MG tablet Take 25 mg by mouth at bedtime.   fenofibrate 160 MG tablet Take 1 tablet (160 mg total) by mouth at bedtime.   omeprazole (PRILOSEC) 20 MG capsule Take 20 mg by mouth daily as needed (for acid reflux).    Polyvinyl Alcohol-Povidone (REFRESH OP) Place 1 drop into both eyes daily as needed (dry eyes).   No facility-administered encounter medications on file as of 06/04/2022.    Allergies (verified) Patient has no known allergies.   History: Past Medical History:  Diagnosis Date   Diabetes mellitus without complication ([place])    Gall bladder disease 09/29/2014   Gout    Heart murmur    Hypercholesteremia    Hypertension    Impaired glucose metabolism    PUD (peptic ulcer disease)    Reflux    Renal disorder    1 kidney that is working at 45 %.   Past Surgical History:  Procedure Laterality Date   ABDOMINAL SURGERY     CHOLECYSTECTOMY N/A 09/30/2014   Procedure: LAPAROSCOPIC CHOLECYSTECTOMY WITH INTRAOPERATIVE CHOLANGIOGRAM;  Surgeon: Alphonsa Overall, MD;  Location: [facility];  Service: General;  Laterality: N/A;   COLONOSCOPY N/A 03/03/2015   Procedure: COLONOSCOPY;  Surgeon: Rogene Houston, MD;  [facility];  Service: Endoscopy;  Laterality: N/A;  730   COLONOSCOPY WITH PROPOFOL N/A 03/07/2022   Procedure: COLONOSCOPY WITH PROPOFOL;  Surgeon: Harvel Quale, MD;  [facility]: [facility];  Service: Gastroenterology;  Laterality: N/A;  12:45PM, ASA 1-2   POLYPECTOMY  03/07/2022   Procedure: POLYPECTOMY INTESTINAL;  Surgeon: Jenetta Downer  Roney Marion, MD;  [facility];  Service: Gastroenterology;;   Family History  Problem Relation Age of Onset   Diabetes Mother    Heart attack Mother    Hypertension Sister    Social History   Socioeconomic History   Marital status: Married    Spouse name: Not on file   Number of children: Not on file   Years of education: Not on file   Highest education level: Not on file  Occupational History   Not on file  Tobacco Use   Smoking status: Former    Types: Cigarettes    Quit date: 08/01/1996    Years since quitting: 25.8   Smokeless tobacco: Never  Vaping Use   Vaping Use: Never used  Substance and Sexual  Activity   Alcohol use: No   Drug use: No   Sexual activity: Yes  Other Topics Concern   Not on file  Social History Narrative   Not on file   Social Determinants of Health   Financial Resource Strain: Unknown (06/01/2021)   Overall Financial Resource Strain (CARDIA)    Difficulty of Paying Living Expenses: Patient refused  Food Insecurity: No Food Insecurity (06/01/2021)   Hunger Vital Sign    Worried About Running Out of Food in the Last Year: Never true    Ran Out of Food in the Last Year: Never true  Transportation Needs: No Transportation Needs (06/01/2021)   PRAPARE - Hydrologist (Medical): No    Lack of Transportation (Non-Medical): No  Physical Activity: Sufficiently Active (06/01/2021)   Exercise Vital Sign    Days of Exercise per Week: 5 days    Minutes of Exercise per Session: 60 min  Stress: No Stress Concern Present (06/01/2021)   [place]    Feeling of Stress : Not at all  Social Connections: Moderately Isolated (06/01/2021)   Social Connection and Isolation Panel [NHANES]    Frequency of Communication with Friends and Family: Never    Frequency of Social Gatherings with Friends and Family: Never    Attends Religious Services: More than 4 times per year    Active Member of [employer] or Organizations: No    Attends Archivist Meetings: Never    Marital Status: Married    Tobacco Counseling Counseling given: Not Answered   Clinical Intake:   How often do you need to have someone help you when you read instructions, pamphlets, or other written materials from your doctor or pharmacy?: (P) 1 - Never  Diabetic?yes Nutrition Risk Assessment:  Has the patient had any N/V/D within the last 2 months?  No  Does the patient have any non-healing wounds?  No  Has the patient had any unintentional weight loss or weight gain?  No   Diabetes:  Is the patient diabetic?   Yes  If diabetic, was a CBG obtained today?  No  Did the patient bring in their glucometer from home?  No  How often do you monitor your CBG's? Twice weekly.   Financial Strains and Diabetes Management:  Are you having any financial strains with the device, your supplies or your medication? No .  Does the patient want to be seen by Chronic Care Management for management of their diabetes?  No  Would the patient like to be referred to a Nutritionist or for Diabetic Management?  No   Diabetic Exams:  Diabetic Eye Exam: Overdue for diabetic  eye exam. Pt has been advised about the importance in completing this exam. Patient advised to call and schedule an eye exam. Diabetic Foot Exam: Completed 05/29/22           Activities of Daily Living    06/03/2022    8:41 PM  In your present state of health, do you have any difficulty performing the following activities:  Hearing? 0  Vision? 0  Difficulty concentrating or making decisions? 0  Walking or climbing stairs? 0  Dressing or bathing? 0  Doing errands, shopping? 0  Preparing Food and eating ? N  Using the Toilet? N  In the past six months, have you accidently leaked urine? N  Do you have problems with loss of bowel control? N  Managing your Medications? N  Managing your Finances? N  Housekeeping or managing your Housekeeping? N    Patient Care Team: Lindell Spar, MD as PCP - General (Internal Medicine)  Indicate any recent Medical Services you may have received from other than [facility] providers in the past year (date may be approximate).     Assessment:   This is a routine wellness examination for [place].  Hearing/Vision screen No results found.  Dietary issues and exercise activities discussed:     Goals Addressed   None   Depression Screen    05/29/2022    3:04 PM 11/22/2021    3:53 PM 06/01/2021    2:56 PM 06/01/2021    2:53 PM 05/25/2021    2:15 PM 11/04/2020    8:42 AM 10/14/2019    1:54 PM  PHQ 2/9 Scores   PHQ - 2 Score 0 0 0 0 0 0 0  PHQ- 9 Score 0          Fall Risk    06/03/2022    8:41 PM 05/29/2022    3:04 PM 11/22/2021    3:53 PM 06/01/2021    2:56 PM 05/25/2021    2:15 PM  Fellows in the past year? 0 0 0 0 0  Number falls in past yr:  0 0  0  Injury with Fall?  0 0  0  Risk for fall due to :  No Fall Risks No Fall Risks  No Fall Risks  Follow up  Falls evaluation completed Falls evaluation completed  Falls evaluation completed    [place]:  Any stairs in or around the home? Yes  If so, are there any without handrails? no Home free of loose throw rugs in walkways, pet beds, electrical cords, etc? Yes  Adequate lighting in your home to reduce risk of falls? Yes   ASSISTIVE DEVICES UTILIZED TO PREVENT FALLS:  Life alert? No  Use of a cane, walker or w/c? No  Grab bars in the bathroom? No  Shower chair or bench in shower? No  Elevated toilet seat or a handicapped toilet? No         06/01/2021    2:56 PM  6CIT Screen  What Year? 0 points  What month? 0 points  What time? 0 points  Count back from 20 0 points  Months in reverse 0 points  Repeat phrase 0 points  Total Score 0 points    Immunizations Immunization History  Administered Date(s) Administered   Influenza Split 02/23/2013   Pneumococcal Conjugate-13 06/19/2018   Pneumococcal Polysaccharide-23 08/01/2012, 05/25/2021   Td 05/19/2011   Zoster Recombinat (Shingrix) 05/25/2021, 11/22/2021    TDAP  status: Due, Education has been provided regarding the importance of this vaccine. Advised may receive this vaccine at local pharmacy or Health Dept. Aware to provide a copy of the vaccination record if obtained from local pharmacy or Health Dept. Verbalized acceptance and understanding.  Flu Vaccine status: Due, Education has been provided regarding the importance of this vaccine. Advised may receive this vaccine at local pharmacy or Health Dept. Aware to provide a copy  of the vaccination record if obtained from local pharmacy or Health Dept. Verbalized acceptance and understanding.  Pneumococcal vaccine status: Up to date  Covid-19 vaccine status: Information provided on how to obtain vaccines.   Qualifies for Shingles Vaccine? Yes   Zostavax completed Yes   Shingrix Completed?: Yes  Screening Tests Health Maintenance  Topic Date Due   DTaP/Tdap/Td (2 - Tdap) 05/18/2021   OPHTHALMOLOGY EXAM  04/04/2022   INFLUENZA VACCINE  07/15/2022 (Originally 11/14/2021)   HEMOGLOBIN A1C  11/27/2022   Diabetic kidney evaluation - eGFR measurement  05/30/2023   Diabetic kidney evaluation - Urine ACR  05/30/2023   FOOT EXAM  05/30/2023   Medicare Annual Wellness (AWV)  06/05/2023   COLONOSCOPY (Pts 45-53yr Insurance coverage will need to be confirmed)  03/08/2027   Pneumonia Vaccine 72 Years old  Completed   Hepatitis C Screening  Completed   Zoster Vaccines- Shingrix  Completed   HPV VACCINES  Aged Out   COVID-19 Vaccine  Discontinued    Health Maintenance  Health Maintenance Due  Topic Date Due   DTaP/Tdap/Td (2 - Tdap) 05/18/2021   OPHTHALMOLOGY EXAM  04/04/2022    Colorectal cancer screening: Type of screening: Colonoscopy. Completed 03/07/22. Repeat every 10 years  Lung Cancer Screening: (Low Dose CT Chest recommended if Age 72-80years, 30 pack-year currently smoking OR have quit w/in 15years.) does not qualify.   Lung Cancer Screening Referral: no  Additional Screening:  Hepatitis C Screening: does qualify; Completed 09/27/2014  Vision Screening: Recommended annual ophthalmology exams for early detection of glaucoma and other disorders of the eye. Is the patient up to date with their annual eye exam?  Yes  Who is the provider or what is the name of the office in which the patient attends annual eye exams? [facility] If pt is not established with a provider, would they like to be referred to a provider to establish care? No .   Dental  Screening: Recommended annual dental exams for proper oral hygiene  Community Resource Referral / Chronic Care Management: CRR required this visit?  No   CCM required this visit?  No      Plan:     I have personally reviewed and noted the following in the patient's chart:   Medical and social history Use of alcohol, tobacco or illicit drugs  Current medications and supplements including opioid prescriptions. Patient is not currently taking opioid prescriptions. Functional ability and status Nutritional status Physical activity Advanced directives List of other physicians Hospitalizations, surgeries, and ER visits in previous 12 months Vitals Screenings to include cognitive, depression, and falls Referrals and appointments  In addition, I have reviewed and discussed with patient certain preventive protocols, quality metrics, and best practice recommendations. A written personalized care plan for preventive services as well as general preventive health recommendations were provided to patient.     KQuentin Angst COregon  06/04/2022

## 2022-07-24 DIAGNOSIS — D631 Anemia in chronic kidney disease: Secondary | ICD-10-CM | POA: Diagnosis not present

## 2022-07-24 DIAGNOSIS — I129 Hypertensive chronic kidney disease with stage 1 through stage 4 chronic kidney disease, or unspecified chronic kidney disease: Secondary | ICD-10-CM | POA: Diagnosis not present

## 2022-07-24 DIAGNOSIS — N1832 Chronic kidney disease, stage 3b: Secondary | ICD-10-CM | POA: Diagnosis not present

## 2022-07-24 DIAGNOSIS — N189 Chronic kidney disease, unspecified: Secondary | ICD-10-CM | POA: Diagnosis not present

## 2022-07-24 DIAGNOSIS — N2581 Secondary hyperparathyroidism of renal origin: Secondary | ICD-10-CM | POA: Diagnosis not present

## 2022-08-02 DIAGNOSIS — Z08 Encounter for follow-up examination after completed treatment for malignant neoplasm: Secondary | ICD-10-CM | POA: Diagnosis not present

## 2022-08-02 DIAGNOSIS — D485 Neoplasm of uncertain behavior of skin: Secondary | ICD-10-CM | POA: Diagnosis not present

## 2022-08-02 DIAGNOSIS — Z1283 Encounter for screening for malignant neoplasm of skin: Secondary | ICD-10-CM | POA: Diagnosis not present

## 2022-08-02 DIAGNOSIS — L570 Actinic keratosis: Secondary | ICD-10-CM | POA: Diagnosis not present

## 2022-08-02 DIAGNOSIS — D225 Melanocytic nevi of trunk: Secondary | ICD-10-CM | POA: Diagnosis not present

## 2022-08-02 DIAGNOSIS — D2271 Melanocytic nevi of right lower limb, including hip: Secondary | ICD-10-CM | POA: Diagnosis not present

## 2022-08-02 DIAGNOSIS — X32XXXD Exposure to sunlight, subsequent encounter: Secondary | ICD-10-CM | POA: Diagnosis not present

## 2022-08-02 DIAGNOSIS — Z85828 Personal history of other malignant neoplasm of skin: Secondary | ICD-10-CM | POA: Diagnosis not present

## 2022-08-02 DIAGNOSIS — L905 Scar conditions and fibrosis of skin: Secondary | ICD-10-CM | POA: Diagnosis not present

## 2022-08-27 ENCOUNTER — Ambulatory Visit (INDEPENDENT_AMBULATORY_CARE_PROVIDER_SITE_OTHER): Payer: PPO | Admitting: Internal Medicine

## 2022-08-27 ENCOUNTER — Encounter: Payer: Self-pay | Admitting: Internal Medicine

## 2022-08-27 VITALS — BP 134/70 | HR 54 | Ht 67.0 in | Wt 169.8 lb

## 2022-08-27 DIAGNOSIS — N1832 Chronic kidney disease, stage 3b: Secondary | ICD-10-CM

## 2022-08-27 DIAGNOSIS — E785 Hyperlipidemia, unspecified: Secondary | ICD-10-CM

## 2022-08-27 DIAGNOSIS — N183 Chronic kidney disease, stage 3 unspecified: Secondary | ICD-10-CM | POA: Diagnosis not present

## 2022-08-27 DIAGNOSIS — E1122 Type 2 diabetes mellitus with diabetic chronic kidney disease: Secondary | ICD-10-CM

## 2022-08-27 DIAGNOSIS — I10 Essential (primary) hypertension: Secondary | ICD-10-CM

## 2022-08-27 DIAGNOSIS — D225 Melanocytic nevi of trunk: Secondary | ICD-10-CM | POA: Diagnosis not present

## 2022-08-27 DIAGNOSIS — D485 Neoplasm of uncertain behavior of skin: Secondary | ICD-10-CM | POA: Diagnosis not present

## 2022-08-27 NOTE — Assessment & Plan Note (Signed)
Followed by Dr. Lakasha Mcfall-nephrology Last BMP showed GFR - 36, worsening Needs to maintain adequate hydration Avoid nephrotoxic agents including NSAIDs DC Sod bicarb as he had severe GI symptoms, advised to discuss with Nephrology

## 2022-08-27 NOTE — Assessment & Plan Note (Signed)
BP Readings from Last 1 Encounters:  08/27/22 134/70   Uncontrolled with amlodipine and Coreg Since his BP is consistently well-controlled at home, would continue same regimen Counseled for compliance with the medications Advised DASH diet and moderate exercise/walking as tolerated

## 2022-08-27 NOTE — Progress Notes (Signed)
Established Patient Office Visit  Subjective:  Patient ID: Hunter Claxton., male    DOB: 1950-10-17  Age: 72 y.o. MRN: [id number]  CC:  Chief Complaint  Patient presents with   Hypertension    HPI Hunter STALLMAN Sr. is a 72 y.o. male with past medical history of HTN, type II DM, CKD 3 and HLD who presents for f/u of her chronic medical conditions.  HTN: BP is elevated today,, but is well controlled at home around 130s/70s.. Takes amlodipine 10 mg QD and Coreg 12.5 mg BID regularly. Patient denies headache, dizziness, chest pain, dyspnea or palpitations.  Type II DM: Diet controlled.  His HbA1c was 6.1 in 02/24. He denies any polyuria or polydipsia currently.  He strictly follows low-carb diet and does not want to take any medication for it currently.  CKD stage III: He follows up with Dr. Allena Katz in [place].  He denies any dysuria or hematuria currently.  He reports history of undescended testis and having renal anomaly due to it.  Past Medical History:  Diagnosis Date   Diabetes mellitus without complication (HCC)    Gall bladder disease 09/29/2014   Gout    Heart murmur    Hypercholesteremia    Hypertension    Impaired glucose metabolism    PUD (peptic ulcer disease)    Reflux    Renal disorder    1 kidney that is working at 45 %.    Past Surgical History:  Procedure Laterality Date   ABDOMINAL SURGERY     CHOLECYSTECTOMY N/A 09/30/2014   Procedure: LAPAROSCOPIC CHOLECYSTECTOMY WITH INTRAOPERATIVE CHOLANGIOGRAM;  Surgeon: Ovidio Kin, MD;  Location: [facility];  Service: General;  Laterality: N/A;   COLONOSCOPY N/A 03/03/2015   Procedure: COLONOSCOPY;  Surgeon: Malissa Hippo, MD;  [facility];  Service: Endoscopy;  Laterality: N/A;  730   COLONOSCOPY WITH PROPOFOL N/A 03/07/2022   Procedure: COLONOSCOPY WITH PROPOFOL;  Surgeon: Dolores Frame, MD;  [facility];  Service: Gastroenterology;  Laterality: N/A;  12:45PM, ASA 1-2    POLYPECTOMY  03/07/2022   Procedure: POLYPECTOMY INTESTINAL;  Surgeon: Marguerita Merles, Reuel Boom, MD;  [facility];  Service: Gastroenterology;;    Family History  Problem Relation Age of Onset   Diabetes Mother    Heart attack Mother    Hypertension Sister     Social History   Socioeconomic History   Marital status: Married    Spouse name: Not on file   Number of children: Not on file   Years of education: Not on file   Highest education level: Not on file  Occupational History   Not on file  Tobacco Use   Smoking status: Former    Types: Cigarettes    Quit date: 08/01/1996    Years since quitting: 26.0   Smokeless tobacco: Never  Vaping Use   Vaping Use: Never used  Substance and Sexual Activity   Alcohol use: No   Drug use: No   Sexual activity: Yes  Other Topics Concern   Not on file  Social History Narrative   Not on file   Social Determinants of Health   Financial Resource Strain: Low Risk  (06/04/2022)   Overall Financial Resource Strain (CARDIA)    Difficulty of Paying Living Expenses: Not hard at all  Food Insecurity: No Food Insecurity (06/04/2022)   Hunger Vital Sign    Worried About Running Out of Food in the Last Year: Never true  Ran Out of Food in the Last Year: Never true  Transportation Needs: No Transportation Needs (06/04/2022)   PRAPARE - Administrator, Civil Service (Medical): No    Lack of Transportation (Non-Medical): No  Physical Activity: Sufficiently Active (06/04/2022)   Exercise Vital Sign    Days of Exercise per Week: 7 days    Minutes of Exercise per Session: 30 min  Stress: No Stress Concern Present (06/04/2022)   [employer] of Occupational Health - Occupational Stress Questionnaire    Feeling of Stress : Not at all  Social Connections: Socially Integrated (06/04/2022)   Social Connection and Isolation Panel [NHANES]    Frequency of Communication with Friends and Family: More than three times a week     Frequency of Social Gatherings with Friends and Family: More than three times a week    Attends Religious Services: More than 4 times per year    Active Member of [employer] or Organizations: Yes    Attends Engineer, structural: More than 4 times per year    Marital Status: Married  Catering manager Violence: Not At Risk (06/04/2022)   Humiliation, Afraid, Rape, and Kick questionnaire    Fear of Current or Ex-Partner: No    Emotionally Abused: No    Physically Abused: No    Sexually Abused: No    Outpatient Medications Prior to Visit  Medication Sig Dispense Refill   amLODipine (NORVASC) 10 MG tablet Take 1 tablet (10 mg total) by mouth daily. 90 tablet 3   aspirin 81 MG tablet Take 81 mg by mouth daily.     atorvastatin (LIPITOR) 40 MG tablet Take 1 tablet (40 mg total) by mouth daily. 90 tablet 3   carvedilol (COREG) 12.5 MG tablet Take 1 tablet (12.5 mg total) by mouth 2 (two) times daily with a meal. 180 tablet 1   diphenhydrAMINE (BENADRYL) 25 MG tablet Take 25 mg by mouth at bedtime.     fenofibrate 160 MG tablet Take 1 tablet (160 mg total) by mouth at bedtime. 90 tablet 3   omeprazole (PRILOSEC) 20 MG capsule Take 20 mg by mouth daily as needed (for acid reflux).     Polyvinyl Alcohol-Povidone (REFRESH OP) Place 1 drop into both eyes daily as needed (dry eyes).     No facility-administered medications prior to visit.    No Known Allergies  ROS Review of Systems  Constitutional:  Negative for chills and fever.  HENT:  Negative for congestion and sore throat.   Eyes:  Negative for pain and discharge.  Respiratory:  Negative for cough and shortness of breath.   Cardiovascular:  Negative for chest pain and palpitations.  Gastrointestinal:  Negative for constipation, diarrhea, nausea and vomiting.  Endocrine: Negative for polydipsia and polyuria.  Genitourinary:  Negative for dysuria and hematuria.  Musculoskeletal:  Negative for neck pain and neck stiffness.  Skin:   Negative for rash.  Neurological:  Negative for dizziness, weakness, numbness and headaches.  Psychiatric/Behavioral:  Negative for agitation and behavioral problems.       Objective:    Physical Exam Vitals reviewed.  Constitutional:      General: He is not in acute distress.    Appearance: He is not diaphoretic.  HENT:     Head: Normocephalic and atraumatic.     Nose: Nose normal.     Mouth/Throat:     Mouth: Mucous membranes are moist.  Eyes:     General: No scleral icterus.  Extraocular Movements: Extraocular movements intact.  Cardiovascular:     Rate and Rhythm: Normal rate and regular rhythm.     Pulses: Normal pulses.     Heart sounds: Murmur (Systolic, over sternal border) heard.  Pulmonary:     Breath sounds: Normal breath sounds. No wheezing or rales.  Abdominal:     Palpations: Abdomen is soft.     Tenderness: There is no abdominal tenderness.  Musculoskeletal:     Cervical back: Neck supple. No tenderness.     Right lower leg: No edema.     Left lower leg: No edema.  Skin:    General: Skin is warm.     Findings: No rash.  Neurological:     General: No focal deficit present.     Mental Status: He is alert and oriented to person, place, and time.     Sensory: No sensory deficit.     Motor: No weakness.  Psychiatric:        Mood and Affect: Mood normal.        Behavior: Behavior normal.     BP 134/70 (BP Location: Left Arm)   Pulse (!) 54   Ht 5\' 7"  (1.702 m)   Wt 169 lb 12.8 oz (77 kg)   SpO2 97%   BMI 26.59 kg/m  Wt Readings from Last 3 Encounters:  08/27/22 169 lb 12.8 oz (77 kg)  05/29/22 177 lb 3.2 oz (80.4 kg)  03/07/22 157 lb (71.2 kg)    No results found for: "TSH" Lab Results  Component Value Date   WBC 5.7 07/20/2020   HGB 13.9 07/20/2020   HCT 42.6 07/20/2020   MCV 90 07/20/2020   PLT 172 07/20/2020   Lab Results  Component Value Date   NA 140 05/29/2022   K 4.3 05/29/2022   CO2 18 (L) 05/29/2022   GLUCOSE 96 05/29/2022    BUN 30 (H) 05/29/2022   CREATININE 2.01 (H) 05/29/2022   BILITOT 0.6 11/04/2020   ALKPHOS 50 11/04/2020   AST 32 11/04/2020   ALT 23 11/04/2020   PROT 6.9 11/04/2020   ALBUMIN 4.5 11/04/2020   CALCIUM 9.4 05/29/2022   ANIONGAP 11 10/02/2014   EGFR 35 (L) 05/29/2022   Lab Results  Component Value Date   CHOL 137 05/29/2022   Lab Results  Component Value Date   HDL 36 (L) 05/29/2022   Lab Results  Component Value Date   LDLCALC 74 05/29/2022   Lab Results  Component Value Date   TRIG 154 (H) 05/29/2022   Lab Results  Component Value Date   CHOLHDL 3.8 05/29/2022   Lab Results  Component Value Date   HGBA1C 6.1 (H) 05/29/2022      Assessment & Plan:   Problem List Items Addressed This Visit       Cardiovascular and Mediastinum   HTN (hypertension) - Primary    BP Readings from Last 1 Encounters:  08/27/22 134/70  Uncontrolled with amlodipine and Coreg Since his BP is consistently well-controlled at home, would continue same regimen Counseled for compliance with the medications Advised DASH diet and moderate exercise/walking as tolerated      Relevant Orders   TSH     Endocrine   Controlled type 2 diabetes mellitus with chronic kidney disease (HCC)    Lab Results  Component Value Date   HGBA1C 6.1 (H) 05/29/2022   Diet controlled Advised to follow diabetic diet On statin F/u CMP and lipid panel Diabetic eye exam: Advised to  follow up with Ophthalmology for diabetic eye exam      Relevant Orders   Hemoglobin A1c     Genitourinary   Chronic renal failure, stage 3 (moderate) (HCC)    Followed by Dr. Erion Weightman-nephrology Last BMP showed GFR - 36, worsening Needs to maintain adequate hydration Avoid nephrotoxic agents including NSAIDs DC Sod bicarb as he had severe GI symptoms, advised to discuss with Nephrology        Other   Hyperlipidemia LDL goal <100    On statin and fenofibrate currently We will check lipid profile      Relevant  Orders   Lipid Profile   No orders of the defined types were placed in this encounter.   Follow-up: Return in about 4 months (around 12/28/2022) for Annual physical.    Anabel Halon, MD

## 2022-08-27 NOTE — Assessment & Plan Note (Signed)
Lab Results  Component Value Date   HGBA1C 6.1 (H) 05/29/2022    Diet controlled Advised to follow diabetic diet On statin F/u CMP and lipid panel Diabetic eye exam: Advised to follow up with Ophthalmology for diabetic eye exam

## 2022-08-27 NOTE — Patient Instructions (Signed)
Please continue to take medications as prescribed. ? ?Please continue to follow low salt diet and ambulate as tolerated. ?

## 2022-08-27 NOTE — Assessment & Plan Note (Signed)
On statin and fenofibrate currently We will check lipid profile 

## 2022-11-14 DIAGNOSIS — E1122 Type 2 diabetes mellitus with diabetic chronic kidney disease: Secondary | ICD-10-CM | POA: Diagnosis not present

## 2022-11-14 DIAGNOSIS — N183 Chronic kidney disease, stage 3 unspecified: Secondary | ICD-10-CM | POA: Diagnosis not present

## 2022-11-14 DIAGNOSIS — I10 Essential (primary) hypertension: Secondary | ICD-10-CM | POA: Diagnosis not present

## 2022-11-14 DIAGNOSIS — E785 Hyperlipidemia, unspecified: Secondary | ICD-10-CM | POA: Diagnosis not present

## 2022-11-20 ENCOUNTER — Other Ambulatory Visit: Payer: Self-pay | Admitting: Internal Medicine

## 2022-11-20 DIAGNOSIS — I10 Essential (primary) hypertension: Secondary | ICD-10-CM

## 2022-11-21 DIAGNOSIS — N1832 Chronic kidney disease, stage 3b: Secondary | ICD-10-CM | POA: Diagnosis not present

## 2022-11-21 DIAGNOSIS — N2581 Secondary hyperparathyroidism of renal origin: Secondary | ICD-10-CM | POA: Diagnosis not present

## 2022-11-21 DIAGNOSIS — I129 Hypertensive chronic kidney disease with stage 1 through stage 4 chronic kidney disease, or unspecified chronic kidney disease: Secondary | ICD-10-CM | POA: Diagnosis not present

## 2022-11-21 DIAGNOSIS — D631 Anemia in chronic kidney disease: Secondary | ICD-10-CM | POA: Diagnosis not present

## 2022-11-29 DIAGNOSIS — D2339 Other benign neoplasm of skin of other parts of face: Secondary | ICD-10-CM | POA: Diagnosis not present

## 2022-11-29 DIAGNOSIS — D485 Neoplasm of uncertain behavior of skin: Secondary | ICD-10-CM | POA: Diagnosis not present

## 2022-11-29 DIAGNOSIS — L821 Other seborrheic keratosis: Secondary | ICD-10-CM | POA: Diagnosis not present

## 2022-11-29 DIAGNOSIS — D225 Melanocytic nevi of trunk: Secondary | ICD-10-CM | POA: Diagnosis not present

## 2022-12-13 DIAGNOSIS — D485 Neoplasm of uncertain behavior of skin: Secondary | ICD-10-CM | POA: Diagnosis not present

## 2022-12-13 DIAGNOSIS — D2339 Other benign neoplasm of skin of other parts of face: Secondary | ICD-10-CM | POA: Diagnosis not present

## 2023-01-02 ENCOUNTER — Encounter: Payer: PPO | Admitting: Internal Medicine

## 2023-01-03 ENCOUNTER — Encounter: Payer: Self-pay | Admitting: Internal Medicine

## 2023-01-31 ENCOUNTER — Encounter: Payer: Self-pay | Admitting: Internal Medicine

## 2023-01-31 ENCOUNTER — Ambulatory Visit: Payer: PPO | Admitting: Internal Medicine

## 2023-01-31 VITALS — BP 132/72 | HR 51 | Ht 67.0 in | Wt 171.2 lb

## 2023-01-31 DIAGNOSIS — Z125 Encounter for screening for malignant neoplasm of prostate: Secondary | ICD-10-CM | POA: Diagnosis not present

## 2023-01-31 DIAGNOSIS — Z0001 Encounter for general adult medical examination with abnormal findings: Secondary | ICD-10-CM | POA: Diagnosis not present

## 2023-01-31 DIAGNOSIS — E038 Other specified hypothyroidism: Secondary | ICD-10-CM | POA: Diagnosis not present

## 2023-01-31 DIAGNOSIS — E1122 Type 2 diabetes mellitus with diabetic chronic kidney disease: Secondary | ICD-10-CM | POA: Diagnosis not present

## 2023-01-31 DIAGNOSIS — N1832 Chronic kidney disease, stage 3b: Secondary | ICD-10-CM

## 2023-01-31 DIAGNOSIS — E785 Hyperlipidemia, unspecified: Secondary | ICD-10-CM

## 2023-01-31 DIAGNOSIS — N183 Chronic kidney disease, stage 3 unspecified: Secondary | ICD-10-CM | POA: Diagnosis not present

## 2023-01-31 DIAGNOSIS — I10 Essential (primary) hypertension: Secondary | ICD-10-CM | POA: Diagnosis not present

## 2023-01-31 NOTE — Assessment & Plan Note (Signed)
Lab Results  Component Value Date   HGBA1C 6.0 (H) 11/14/2022    Diet controlled Advised to follow diabetic diet On statin F/u CMP and lipid panel Diabetic eye exam: Advised to follow up with Ophthalmology for diabetic eye exam

## 2023-01-31 NOTE — Progress Notes (Signed)
Established Patient Office Visit  Subjective:  Patient ID: Hunter Mcmeans., male    DOB: 12-15-1950  Age: 72 y.o. MRN: [id number]  CC:  Chief Complaint  Patient presents with   Annual Exam    HPI Hunter ROSBERG Sr. is a 72 y.o. male with past medical history of HTN, type II DM, CKD 3 and HLD who presents for annual physical.  HTN: BP is elevated today, but is well controlled at home around 130s/70s. Takes amlodipine 10 mg QD and Coreg 12.5 mg BID regularly. Patient denies headache, dizziness, chest pain, dyspnea or palpitations.  Type II DM: Diet controlled.  His HbA1c was 6.0 in 07/24. He denies any polyuria or polydipsia currently.  He strictly follows low-carb diet.  CKD stage III: He follows up with [place]. Allena Katz in [place].  He denies any dysuria or hematuria currently.  He reports history of undescended testis and having renal anomaly due to it.  His TSH was borderline elevated in 07/24. Denies any change in appetite or weight, chronic fatigue, or tremors.  Past Medical History:  Diagnosis Date   Diabetes mellitus without complication (HCC)    Gall bladder disease 09/29/2014   Gout    Heart murmur    Hypercholesteremia    Hypertension    Impaired glucose metabolism    PUD (peptic ulcer disease)    Reflux    Renal disorder    1 kidney that is working at 45 %.    Past Surgical History:  Procedure Laterality Date   ABDOMINAL SURGERY     CHOLECYSTECTOMY N/A 09/30/2014   Procedure: LAPAROSCOPIC CHOLECYSTECTOMY WITH INTRAOPERATIVE CHOLANGIOGRAM;  Surgeon: Ovidio Kin, MD;  Location: [facility];  Service: General;  Laterality: N/A;   COLONOSCOPY N/A 03/03/2015   Procedure: COLONOSCOPY;  Surgeon: Malissa Hippo, MD;  Location: [facility];  Service: Endoscopy;  Laterality: N/A;  730   COLONOSCOPY WITH PROPOFOL N/A 03/07/2022   Procedure: COLONOSCOPY WITH PROPOFOL;  Surgeon: Dolores Frame, MD;  Location: [facility];  Service: Gastroenterology;  Laterality: N/A;   12:45PM, ASA 1-2   POLYPECTOMY  03/07/2022   Procedure: POLYPECTOMY INTESTINAL;  Surgeon: Marguerita Merles, Reuel Boom, MD;  Location: [facility];  Service: Gastroenterology;;    Family History  Problem Relation Age of Onset   Diabetes Mother    Heart attack Mother    Hypertension Sister     Social History   Socioeconomic History   Marital status: Married    Spouse name: Not on file   Number of children: Not on file   Years of education: Not on file   Highest education level: Not on file  Occupational History   Not on file  Tobacco Use   Smoking status: Former    Current packs/day: 0.00    Types: Cigarettes    Quit date: 08/01/1996    Years since quitting: 26.5   Smokeless tobacco: Never  Vaping Use   Vaping status: Never Used  Substance and Sexual Activity   Alcohol use: No   Drug use: No   Sexual activity: Yes  Other Topics Concern   Not on file  Social History Narrative   Not on file   Social Determinants of Health   Financial Resource Strain: Low Risk  (06/04/2022)   Overall Financial Resource Strain (CARDIA)    Difficulty of Paying Living Expenses: Not hard at all  Food Insecurity: No Food Insecurity (06/04/2022)   Hunger Vital Sign    Worried About  Running Out of Food in the Last Year: Never true    Ran Out of Food in the Last Year: Never true  Transportation Needs: No Transportation Needs (06/04/2022)   PRAPARE - Administrator, Civil Service (Medical): No    Lack of Transportation (Non-Medical): No  Physical Activity: Sufficiently Active (06/04/2022)   Exercise Vital Sign    Days of Exercise per Week: 7 days    Minutes of Exercise per Session: 30 min  Stress: No Stress Concern Present (06/04/2022)   [employer] of Occupational Health - Occupational Stress Questionnaire    Feeling of Stress : Not at all  Social Connections: Socially Integrated (06/04/2022)   Social Connection and Isolation Panel [NHANES]    Frequency of Communication  with Friends and Family: More than three times a week    Frequency of Social Gatherings with Friends and Family: More than three times a week    Attends Religious Services: More than 4 times per year    Active Member of [employer] or Organizations: Yes    Attends Engineer, structural: More than 4 times per year    Marital Status: Married  Catering manager Violence: Not At Risk (06/04/2022)   Humiliation, Afraid, Rape, and Kick questionnaire    Fear of Current or Ex-Partner: No    Emotionally Abused: No    Physically Abused: No    Sexually Abused: No    Outpatient Medications Prior to Visit  Medication Sig Dispense Refill   amLODipine (NORVASC) 10 MG tablet Take 1 tablet (10 mg total) by mouth daily. 90 tablet 3   aspirin 81 MG tablet Take 81 mg by mouth daily.     atorvastatin (LIPITOR) 40 MG tablet Take 1 tablet (40 mg total) by mouth daily. 90 tablet 3   carvedilol (COREG) 12.5 MG tablet TAKE 1 TABLET BY MOUTH TWICE DAILY WITH A MEAL 180 tablet 0   diphenhydrAMINE (BENADRYL) 25 MG tablet Take 25 mg by mouth at bedtime.     fenofibrate 160 MG tablet Take 1 tablet (160 mg total) by mouth at bedtime. 90 tablet 3   omeprazole (PRILOSEC) 20 MG capsule Take 20 mg by mouth daily as needed (for acid reflux).     Polyvinyl Alcohol-Povidone (REFRESH OP) Place 1 drop into both eyes daily as needed (dry eyes).     No facility-administered medications prior to visit.    No Known Allergies  ROS Review of Systems  Constitutional:  Negative for chills and fever.  HENT:  Negative for congestion and sore throat.   Eyes:  Negative for pain and discharge.  Respiratory:  Negative for cough and shortness of breath.   Cardiovascular:  Negative for chest pain and palpitations.  Gastrointestinal:  Negative for constipation, diarrhea, nausea and vomiting.  Endocrine: Negative for polydipsia and polyuria.  Genitourinary:  Negative for dysuria and hematuria.  Musculoskeletal:  Negative for neck pain  and neck stiffness.  Skin:  Negative for rash.  Neurological:  Negative for dizziness, weakness, numbness and headaches.  Psychiatric/Behavioral:  Negative for agitation and behavioral problems.       Objective:    Physical Exam Vitals reviewed.  Constitutional:      General: He is not in acute distress.    Appearance: He is not diaphoretic.  HENT:     Head: Normocephalic and atraumatic.     Nose: Nose normal.     Mouth/Throat:     Mouth: Mucous membranes are moist.  Eyes:  General: No scleral icterus.    Extraocular Movements: Extraocular movements intact.  Cardiovascular:     Rate and Rhythm: Normal rate and regular rhythm.     Pulses: Normal pulses.     Heart sounds: Murmur (Systolic, over sternal border) heard.  Pulmonary:     Breath sounds: Normal breath sounds. No wheezing or rales.  Abdominal:     Palpations: Abdomen is soft.     Tenderness: There is no abdominal tenderness.  Musculoskeletal:     Cervical back: Neck supple. No tenderness.     Right lower leg: No edema.     Left lower leg: No edema.  Skin:    General: Skin is warm.     Findings: No rash.  Neurological:     General: No focal deficit present.     Mental Status: He is alert and oriented to person, place, and time.     Cranial Nerves: No cranial nerve deficit.     Sensory: No sensory deficit.     Motor: No weakness.  Psychiatric:        Mood and Affect: Mood normal.        Behavior: Behavior normal.     BP 132/72 (BP Location: Left Arm) Comment: Home  Pulse (!) 51   Ht 5\' 7"  (1.702 m)   Wt 171 lb 3.2 oz (77.7 kg)   SpO2 98%   BMI 26.81 kg/m  Wt Readings from Last 3 Encounters:  01/31/23 171 lb 3.2 oz (77.7 kg)  08/27/22 169 lb 12.8 oz (77 kg)  05/29/22 177 lb 3.2 oz (80.4 kg)    Lab Results  Component Value Date   TSH 5.470 (H) 11/14/2022   Lab Results  Component Value Date   WBC 5.7 07/20/2020   HGB 13.9 07/20/2020   HCT 42.6 07/20/2020   MCV 90 07/20/2020   PLT 172  07/20/2020   Lab Results  Component Value Date   NA 140 05/29/2022   K 4.3 05/29/2022   CO2 18 (L) 05/29/2022   GLUCOSE 96 05/29/2022   BUN 30 (H) 05/29/2022   CREATININE 2.01 (H) 05/29/2022   BILITOT 0.6 11/04/2020   ALKPHOS 50 11/04/2020   AST 32 11/04/2020   ALT 23 11/04/2020   PROT 6.9 11/04/2020   ALBUMIN 4.5 11/04/2020   CALCIUM 9.4 05/29/2022   ANIONGAP 11 10/02/2014   EGFR 35 (L) 05/29/2022   Lab Results  Component Value Date   CHOL 122 11/14/2022   Lab Results  Component Value Date   HDL 34 (L) 11/14/2022   Lab Results  Component Value Date   LDLCALC 72 11/14/2022   Lab Results  Component Value Date   TRIG 78 11/14/2022   Lab Results  Component Value Date   CHOLHDL 3.6 11/14/2022   Lab Results  Component Value Date   HGBA1C 6.0 (H) 11/14/2022      Assessment & Plan:   Problem List Items Addressed This Visit       Cardiovascular and Mediastinum   HTN (hypertension)    BP Readings from Last 1 Encounters:  01/31/23 (!) 154/75   Well-controlled with amlodipine and Coreg at home Since his BP is consistently well-controlled at home, would continue same regimen Counseled for compliance with the medications Advised DASH diet and moderate exercise/walking as tolerated        Endocrine   Controlled type 2 diabetes mellitus with chronic kidney disease (HCC)    Lab Results  Component Value Date   HGBA1C 6.0 (  H) 11/14/2022    Diet controlled Advised to follow diabetic diet On statin F/u CMP and lipid panel Diabetic eye exam: Advised to follow up with Ophthalmology for diabetic eye exam      Relevant Orders   Hemoglobin A1c   Subclinical hypothyroidism    Lab Results  Component Value Date   TSH 5.470 (H) 11/14/2022   Recheck TSH and free T4 Currently asymptomatic      Relevant Orders   TSH + free T4     Genitourinary   Chronic renal failure, stage 3 (moderate) (HCC)    Followed by Dr. Rhian Asebedo-nephrology Last BMP showed GFR - 39,  overall stable Needs to maintain adequate hydration Avoid nephrotoxic agents including NSAIDs        Other   Hyperlipidemia LDL goal <100    On statin and fenofibrate currently Checked lipid profile      Encounter for general adult medical examination with abnormal findings - Primary    Physical exam as documented. Fasting blood tests ordered today. Advised to get Tdap vaccines at local pharmacy.      Prostate cancer screening    Ordered PSA after discussing its limitations for prostate cancer screening, including false positive results leading to additional investigations.      Relevant Orders   PSA    No orders of the defined types were placed in this encounter.   Follow-up: Return in about 6 months (around 08/01/2023) for HTN and CKD.    Hunter Halon, MD

## 2023-01-31 NOTE — Assessment & Plan Note (Signed)
Lab Results  Component Value Date   TSH 5.470 (H) 11/14/2022   Recheck TSH and free T4 Currently asymptomatic

## 2023-01-31 NOTE — Assessment & Plan Note (Signed)
Ordered PSA after discussing its limitations for prostate cancer screening, including false positive results leading to additional investigations. 

## 2023-01-31 NOTE — Assessment & Plan Note (Addendum)
Physical exam as documented. Fasting blood tests ordered today. Advised to get Tdap vaccines at local pharmacy.

## 2023-01-31 NOTE — Assessment & Plan Note (Addendum)
Followed by Dr. Khelani Kops-nephrology Last BMP showed GFR - 39, overall stable Needs to maintain adequate hydration Avoid nephrotoxic agents including NSAIDs

## 2023-01-31 NOTE — Assessment & Plan Note (Signed)
On statin and fenofibrate currently Checked lipid profile

## 2023-01-31 NOTE — Patient Instructions (Addendum)
Please continue to take medications as prescribed.  Please continue to follow low carb diet and perform moderate exercise/walking at least 150 mins/week.  Please get blood tests done when you get blood tests for Nephrology.

## 2023-01-31 NOTE — Assessment & Plan Note (Signed)
BP Readings from Last 1 Encounters:  01/31/23 (!) 154/75   Well-controlled with amlodipine and Coreg at home Since his BP is consistently well-controlled at home, would continue same regimen Counseled for compliance with the medications Advised DASH diet and moderate exercise/walking as tolerated

## 2023-02-14 ENCOUNTER — Other Ambulatory Visit: Payer: Self-pay | Admitting: Internal Medicine

## 2023-02-14 DIAGNOSIS — I10 Essential (primary) hypertension: Secondary | ICD-10-CM

## 2023-05-13 ENCOUNTER — Other Ambulatory Visit: Payer: Self-pay | Admitting: Internal Medicine

## 2023-05-13 DIAGNOSIS — I10 Essential (primary) hypertension: Secondary | ICD-10-CM

## 2023-05-14 DIAGNOSIS — N1832 Chronic kidney disease, stage 3b: Secondary | ICD-10-CM | POA: Diagnosis not present

## 2023-05-20 ENCOUNTER — Other Ambulatory Visit: Payer: Self-pay | Admitting: Internal Medicine

## 2023-05-20 DIAGNOSIS — I10 Essential (primary) hypertension: Secondary | ICD-10-CM

## 2023-05-21 DIAGNOSIS — N1832 Chronic kidney disease, stage 3b: Secondary | ICD-10-CM | POA: Diagnosis not present

## 2023-05-21 DIAGNOSIS — M109 Gout, unspecified: Secondary | ICD-10-CM | POA: Diagnosis not present

## 2023-05-21 DIAGNOSIS — N2581 Secondary hyperparathyroidism of renal origin: Secondary | ICD-10-CM | POA: Diagnosis not present

## 2023-05-21 DIAGNOSIS — D631 Anemia in chronic kidney disease: Secondary | ICD-10-CM | POA: Diagnosis not present

## 2023-05-21 DIAGNOSIS — I129 Hypertensive chronic kidney disease with stage 1 through stage 4 chronic kidney disease, or unspecified chronic kidney disease: Secondary | ICD-10-CM | POA: Diagnosis not present

## 2023-06-17 ENCOUNTER — Other Ambulatory Visit: Payer: Self-pay | Admitting: Internal Medicine

## 2023-06-17 DIAGNOSIS — E785 Hyperlipidemia, unspecified: Secondary | ICD-10-CM

## 2023-06-20 ENCOUNTER — Other Ambulatory Visit: Payer: Self-pay | Admitting: Internal Medicine

## 2023-06-20 DIAGNOSIS — E785 Hyperlipidemia, unspecified: Secondary | ICD-10-CM

## 2023-07-08 ENCOUNTER — Ambulatory Visit (INDEPENDENT_AMBULATORY_CARE_PROVIDER_SITE_OTHER): Payer: PPO

## 2023-07-08 VITALS — BP 130/70 | Ht 67.0 in | Wt 165.0 lb

## 2023-07-08 DIAGNOSIS — Z0000 Encounter for general adult medical examination without abnormal findings: Secondary | ICD-10-CM

## 2023-07-08 DIAGNOSIS — Z0001 Encounter for general adult medical examination with abnormal findings: Secondary | ICD-10-CM

## 2023-07-08 DIAGNOSIS — N183 Chronic kidney disease, stage 3 unspecified: Secondary | ICD-10-CM

## 2023-07-08 DIAGNOSIS — E1122 Type 2 diabetes mellitus with diabetic chronic kidney disease: Secondary | ICD-10-CM

## 2023-07-08 NOTE — Progress Notes (Signed)
 Because this visit was a virtual/telehealth visit,  certain criteria was not obtained, such a blood pressure, CBG if applicable, and timed get up and go. Any medications not marked as "taking" were not mentioned during the medication reconciliation part of the visit. Any vitals not documented were not able to be obtained due to this being a telehealth visit or patient was unable to self-report a recent blood pressure reading due to a lack of equipment at home via telehealth. Vitals that have been documented are verbally provided by the patient.   Subjective:   Hunter PRINGLE Sr. is a 73 y.o. who presents for a Medicare Wellness preventive visit.  Visit Complete: Virtual I connected with  Hunter Fries Sr. on 07/08/23 by a audio enabled telemedicine application and verified that I am speaking with the correct person using two identifiers.  Patient Location: Home  Provider Location: Home Office  I discussed the limitations of evaluation and management by telemedicine. The patient expressed understanding and agreed to proceed.  Vital Signs: Because this visit was a virtual/telehealth visit, some criteria may be missing or patient reported. Any vitals not documented were not able to be obtained and vitals that have been documented are patient reported.  VideoError- Librarian, academic were attempted between this provider and patient, however failed, due to patient having technical difficulties OR patient did not have access to video capability.  We continued and completed visit with audio only.   Persons Participating in Visit: Patient.  AWV Questionnaire: No: Patient Medicare AWV questionnaire was not completed prior to this visit.  Cardiac Risk Factors include: advanced age (>34men, >52 women);diabetes mellitus;male gender;hypertension;dyslipidemia     Objective:    Today's Vitals   07/08/23 1044  BP: 130/70  Weight: 165 lb (74.8 kg)  Height: 5\' 7"  (1.702 m)    Body mass index is 25.84 kg/m.     07/08/2023   10:46 AM 06/04/2022   11:29 AM 03/07/2022   11:18 AM 06/01/2021    2:55 PM 03/03/2015    7:27 AM 09/29/2014    4:52 PM 09/26/2014    7:22 PM  Advanced Directives  Does Patient Have a Medical Advance Directive? No Yes No Yes Yes Yes No  Type of Special educational needs teacher of [place];Living will   Healthcare Power of [place];Living will Living will   Does patient want to make changes to medical advance directive?  Yes (ED - Information included in AVS)  No - Patient declined  No - Patient declined   Copy of Healthcare Power of Attorney in Chart?  No - copy requested   No - copy requested No - copy requested   Would patient like information on creating a medical advance directive? No - Patient declined No - Patient declined No - Patient declined    No - patient declined information    Current Medications (verified) Outpatient Encounter Medications as of 07/08/2023  Medication Sig   amLODipine (NORVASC) 10 MG tablet Take 1 tablet by mouth once daily   aspirin 81 MG tablet Take 81 mg by mouth daily.   atorvastatin (LIPITOR) 40 MG tablet Take 1 tablet by mouth once daily   carvedilol (COREG) 12.5 MG tablet TAKE 1 TABLET BY MOUTH TWICE DAILY WITH A MEAL   diphenhydrAMINE (BENADRYL) 25 MG tablet Take 25 mg by mouth at bedtime.   fenofibrate 160 MG tablet TAKE 1 TABLET BY MOUTH AT BEDTIME   omeprazole (PRILOSEC) 20 MG capsule Take 20 mg  by mouth daily as needed (for acid reflux).   Polyvinyl Alcohol-Povidone (REFRESH OP) Place 1 drop into both eyes daily as needed (dry eyes).   No facility-administered encounter medications on file as of 07/08/2023.    Allergies (verified) Patient has no known allergies.   History: Past Medical History:  Diagnosis Date   Diabetes mellitus without complication (HCC)    Gall bladder disease 09/29/2014   Gout    Heart murmur    Hypercholesteremia    Hypertension    Impaired glucose metabolism     PUD (peptic ulcer disease)    Reflux    Renal disorder    1 kidney that is working at 45 %.   Past Surgical History:  Procedure Laterality Date   ABDOMINAL SURGERY     CHOLECYSTECTOMY N/A 09/30/2014   Procedure: LAPAROSCOPIC CHOLECYSTECTOMY WITH INTRAOPERATIVE CHOLANGIOGRAM;  Surgeon: Ovidio Kin, MD;  Location: [facility];  Service: General;  Laterality: N/A;   COLONOSCOPY N/A 03/03/2015   Procedure: COLONOSCOPY;  Surgeon: Malissa Hippo, MD;  Location: [facility];  Service: Endoscopy;  Laterality: N/A;  730   COLONOSCOPY WITH PROPOFOL N/A 03/07/2022   Procedure: COLONOSCOPY WITH PROPOFOL;  Surgeon: Dolores Frame, MD;  Location: [facility];  Service: Gastroenterology;  Laterality: N/A;  12:45PM, ASA 1-2   POLYPECTOMY  03/07/2022   Procedure: POLYPECTOMY INTESTINAL;  Surgeon: Marguerita Merles, Reuel Boom, MD;  Location: [facility];  Service: Gastroenterology;;   Family History  Problem Relation Age of Onset   Diabetes Mother    Heart attack Mother    Hypertension Sister    Social History   Socioeconomic History   Marital status: Married    Spouse name: Not on file   Number of children: Not on file   Years of education: Not on file   Highest education level: Not on file  Occupational History   Not on file  Tobacco Use   Smoking status: Former    Current packs/day: 0.00    Types: Cigarettes    Quit date: 08/01/1996    Years since quitting: 26.9   Smokeless tobacco: Never  Vaping Use   Vaping status: Never Used  Substance and Sexual Activity   Alcohol use: No   Drug use: No   Sexual activity: Yes  Other Topics Concern   Not on file  Social History Narrative   Not on file   Social Drivers of Health   Financial Resource Strain: Low Risk  (07/08/2023)   Overall Financial Resource Strain (CARDIA)    Difficulty of Paying Living Expenses: Not hard at all  Food Insecurity: No Food Insecurity (07/08/2023)   Hunger Vital Sign    Worried About Running Out  of Food in the Last Year: Never true    Ran Out of Food in the Last Year: Never true  Transportation Needs: No Transportation Needs (07/08/2023)   PRAPARE - Administrator, Civil Service (Medical): No    Lack of Transportation (Non-Medical): No  Physical Activity: Sufficiently Active (07/08/2023)   Exercise Vital Sign    Days of Exercise per Week: 7 days    Minutes of Exercise per Session: 30 min  Stress: No Stress Concern Present (07/08/2023)   [employer] of Occupational Health - Occupational Stress Questionnaire    Feeling of Stress : Not at all  Social Connections: Socially Integrated (07/08/2023)   Social Connection and Isolation Panel [NHANES]    Frequency of Communication with Friends and Family: More than three  times a week    Frequency of Social Gatherings with Friends and Family: More than three times a week    Attends Religious Services: More than 4 times per year    Active Member of [employer] or Organizations: Yes    Attends Engineer, structural: More than 4 times per year    Marital Status: Married    Tobacco Counseling Counseling given: Yes    Clinical Intake:  Pre-visit preparation completed: Yes  Pain : No/denies pain     BMI - recorded: 25.84 Nutritional Risks: None Diabetes: Yes CBG done?: No Did pt. bring in CBG monitor from home?: No  Lab Results  Component Value Date   HGBA1C 6.0 (H) 11/14/2022   HGBA1C 6.1 (H) 05/29/2022   HGBA1C 6.2 11/22/2021   HGBA1C 6.2 11/22/2021   HGBA1C 6.2 11/22/2021     How often do you need to have someone help you when you read instructions, pamphlets, or other written materials from your doctor or pharmacy?: 1 - Never  Interpreter Needed?: No  Information entered by :: Maryjean Ka CMA   Activities of Daily Living     07/08/2023   10:45 AM  In your present state of health, do you have any difficulty performing the following activities:  Hearing? 0  Vision? 0  Difficulty concentrating or  making decisions? 0  Walking or climbing stairs? 0  Dressing or bathing? 0  Doing errands, shopping? 0  Preparing Food and eating ? N  Using the Toilet? N  In the past six months, have you accidently leaked urine? N  Do you have problems with loss of bowel control? N  Managing your Medications? N  Managing your Finances? N  Housekeeping or managing your Housekeeping? N    Patient Care Team: Anabel Halon, MD as PCP - General (Internal Medicine) [facility], OD Allena Katz Gillie Manners, DPM as Consulting Physician (Podiatry) Marguerita Merles, Reuel Boom, MD as Consulting Physician (Gastroenterology) Nita Sells, MD (Dermatology) Dagoberto Ligas, MD as Consulting Physician (Nephrology)  Indicate any recent Medical Services you may have received from other than [facility] providers in the past year (date may be approximate).     Assessment:   This is a routine wellness examination for Hunter Franklin.  Hearing/Vision screen Hearing Screening - Comments:: Patient denies any hearing difficulties.   Vision Screening - Comments:: Wears rx glasses - up to date with routine eye exams  Sees Dr. Desiree Lucy in [place]   Goals Addressed             This Visit's Progress    Patient Stated       To continue to work        Depression Screen     07/08/2023   11:02 AM 01/31/2023    1:04 PM 08/27/2022    3:19 PM 06/04/2022   11:30 AM 06/04/2022   11:29 AM 05/29/2022    3:04 PM 11/22/2021    3:53 PM  PHQ 2/9 Scores  PHQ - 2 Score 0 0 0 0 0 0 0  PHQ- 9 Score 0 0  0 0 0     Fall Risk     07/08/2023   10:48 AM 01/31/2023    1:04 PM 08/27/2022    3:19 PM 06/04/2022   11:29 AM 06/03/2022    8:41 PM  Fall Risk   Falls in the past year? 0 0 0 0 0  Number falls in past yr: 0 0 0 0  Injury with Fall? 0 0 0 0   Risk for fall due to : No Fall Risks No Fall Risks  No Fall Risks   Follow up Falls prevention discussed;Education provided Falls evaluation completed  Falls evaluation completed      MEDICARE RISK AT HOME:  Medicare Risk at Home Any stairs in or around the home?: Yes If so, are there any without handrails?: No Home free of loose throw rugs in walkways, pet beds, electrical cords, etc?: Yes Adequate lighting in your home to reduce risk of falls?: No Life alert?: No Use of a cane, walker or w/c?: No Grab bars in the bathroom?: No Shower chair or bench in shower?: No Elevated toilet seat or a handicapped toilet?: No  TIMED UP AND GO:  Was the test performed?  No  Cognitive Function: 6CIT completed    06/04/2022   11:30 AM  MMSE - Mini Mental State Exam  Not completed: Unable to complete        07/08/2023   10:51 AM 06/04/2022   11:31 AM 06/01/2021    2:56 PM  6CIT Screen  What Year? 0 points 0 points 0 points  What month? 0 points 0 points 0 points  What time? 0 points 0 points 0 points  Count back from 20 0 points 0 points 0 points  Months in reverse 0 points 0 points 0 points  Repeat phrase 0 points 0 points 0 points  Total Score 0 points 0 points 0 points    Immunizations Immunization History  Administered Date(s) Administered   Influenza Split 02/23/2013   Pneumococcal Conjugate-13 06/19/2018   Pneumococcal Polysaccharide-23 08/01/2012, 05/25/2021   Td 05/19/2011   Zoster Recombinant(Shingrix) 05/25/2021, 11/22/2021    Screening Tests Health Maintenance  Topic Date Due   DTaP/Tdap/Td (2 - Tdap) 05/18/2021   OPHTHALMOLOGY EXAM  04/04/2023   HEMOGLOBIN A1C  05/17/2023   Diabetic kidney evaluation - eGFR measurement  05/30/2023   Diabetic kidney evaluation - Urine ACR  05/30/2023   FOOT EXAM  05/30/2023   INFLUENZA VACCINE  07/15/2023 (Originally 11/15/2022)   Medicare Annual Wellness (AWV)  07/07/2024   Colonoscopy  03/08/2027   Pneumonia Vaccine 71+ Years old  Completed   Hepatitis C Screening  Completed   Zoster Vaccines- Shingrix  Completed   HPV VACCINES  Aged Out   COVID-19 Vaccine  Discontinued    Health  Maintenance  Health Maintenance Due  Topic Date Due   DTaP/Tdap/Td (2 - Tdap) 05/18/2021   OPHTHALMOLOGY EXAM  04/04/2023   HEMOGLOBIN A1C  05/17/2023   Diabetic kidney evaluation - eGFR measurement  05/30/2023   Diabetic kidney evaluation - Urine ACR  05/30/2023   FOOT EXAM  05/30/2023   Health Maintenance Items Addressed: CMP/BMP, UACR (Urine Albumin:Creatinine Ratio)  Additional Screening:  Vision Screening: Recommended annual ophthalmology exams for early detection of glaucoma and other disorders of the eye.  Dental Screening: Recommended annual dental exams for proper oral hygiene  Community Resource Referral / Chronic Care Management: CRR required this visit?  No   CCM required this visit?  No     Plan:     I have personally reviewed and noted the following in the patient's chart:   Medical and social history Use of alcohol, tobacco or illicit drugs  Current medications and supplements including opioid prescriptions. Patient is not currently taking opioid prescriptions. Functional ability and status Nutritional status Physical activity Advanced directives List of other physicians Hospitalizations, surgeries, and ER visits in previous  12 months Vitals Screenings to include cognitive, depression, and falls Referrals and appointments  In addition, I have reviewed and discussed with patient certain preventive protocols, quality metrics, and best practice recommendations. A written personalized care plan for preventive services as well as general preventive health recommendations were provided to patient.     Jordan Hawks Jinger Middlesworth, CMA   07/08/2023   After Visit Summary: (MyChart) Due to this being a telephonic visit, the after visit summary with patients personalized plan was offered to patient via MyChart   Notes: Please refer to Routing Comments.

## 2023-07-08 NOTE — Patient Instructions (Signed)
 Hunter Franklin , Thank you for taking time to come for your Medicare Wellness Visit. I appreciate your ongoing commitment to your health goals. Please review the following plan we discussed and let me know if I can assist you in the future.   Referrals/Orders/Follow-Ups/Clinician Recommendations:  Next Medicare Annual Wellness Visit:   July 08, 2024 at 10:00 am telephone visit.   If lab work has been ordered for you today, you may have these drawn at the same lab you have your routine lab work drawn for your primary care provider. Labs Ordered: CMP Urine ACR  This is a list of the screening recommended for you and due dates:  Health Maintenance  Topic Date Due   DTaP/Tdap/Td vaccine (2 - Tdap) 05/18/2021   Eye exam for diabetics  04/04/2023   Hemoglobin A1C  05/17/2023   Yearly kidney function blood test for diabetes  05/30/2023   Yearly kidney health urinalysis for diabetes  05/30/2023   Complete foot exam   05/30/2023   Flu Shot  07/15/2023*   Medicare Annual Wellness Visit  07/07/2024   Colon Cancer Screening  03/08/2027   Pneumonia Vaccine  Completed   Hepatitis C Screening  Completed   Zoster (Shingles) Vaccine  Completed   HPV Vaccine  Aged Out   COVID-19 Vaccine  Discontinued  *Topic was postponed. The date shown is not the original due date.    Advanced directives: (Declined) Advance directive discussed with you today. Even though you declined this today, please call our office should you change your mind, and we can give you the proper paperwork for you to fill out.  Next Medicare Annual Wellness Visit scheduled for next year: yes  Understanding Your Risk for Falls Millions of people have serious injuries from falls each year. It is important to understand your risk of falling. Talk with your health care provider about your risk and what you can do to lower it. If you do have a serious fall, make sure to tell your provider. Falling once raises your risk of falling again. How  can falls affect me? Serious injuries from falls are common. These include: Broken bones, such as hip fractures. Head injuries, such as traumatic brain injuries (TBI) or concussions. A fear of falling can cause you to avoid activities and stay at home. This can make your muscles weaker and raise your risk for a fall. What can increase my risk? There are a number of risk factors that increase your risk for falling. The more risk factors you have, the higher your risk of falling. Serious injuries from a fall happen most often to people who are older than 73 years old. Teenagers and young adults ages 67-29 are also at higher risk. Common risk factors include: Weakness in the lower body. Being generally weak or confused due to long-term (chronic) illness. Dizziness or balance problems. Poor vision. Medicines that cause dizziness or drowsiness. These may include: Medicines for your blood pressure, heart, anxiety, insomnia, or swelling (edema). Pain medicines. Muscle relaxants. Other risk factors include: Drinking alcohol. Having had a fall in the past. Having foot pain or wearing improper footwear. Working at a dangerous job. Having any of the following in your home: Tripping hazards, such as floor clutter or loose rugs. Poor lighting. Pets. Having dementia or memory loss. What actions can I take to lower my risk of falling?     Physical activity Stay physically fit. Do strength and balance exercises. Consider taking a regular class to build strength  and balance. Yoga and tai chi are good options. Vision Have your eyes checked every year and your prescription for glasses or contacts updated as needed. Shoes and walking aids Wear non-skid shoes. Wear shoes that have rubber soles and low heels. Do not wear high heels. Do not walk around the house in socks or slippers. Use a cane or walker as told by your provider. Home safety Attach secure railings on both sides of your  stairs. Install grab bars for your bathtub, shower, and toilet. Use a non-skid mat in your bathtub or shower. Attach bath mats securely with double-sided, non-slip rug tape. Use good lighting in all rooms. Keep a flashlight near your bed. Make sure there is a clear path from your bed to the bathroom. Use night-lights. Do not use throw rugs. Make sure all carpeting is taped or tacked down securely. Remove all clutter from walkways and stairways, including extension cords. Repair uneven or broken steps and floors. Avoid walking on icy or slippery surfaces. Walk on the grass instead of on icy or slick sidewalks. Use ice melter to get rid of ice on walkways in the winter. Use a cordless phone. Questions to ask your health care provider Can you help me check my risk for a fall? Do any of my medicines make me more likely to fall? Should I take a vitamin D supplement? What exercises can I do to improve my strength and balance? Should I make an appointment to have my vision checked? Do I need a bone density test to check for weak bones (osteoporosis)? Would it help to use a cane or a walker? Where to find more information Centers for Disease Control and Prevention, STEADI: TonerPromos.no Community-Based Fall Prevention Programs: TonerPromos.no [employer] on Aging: BaseRingTones.pl Contact a health care provider if: You fall at home. You are afraid of falling at home. You feel weak, drowsy, or dizzy. This information is not intended to replace advice given to you by your health care provider. Make sure you discuss any questions you have with your health care provider. Document Revised: 12/04/2021 Document Reviewed: 12/04/2021 Elsevier Patient Education  2024 [employer].

## 2023-07-26 DIAGNOSIS — H40053 Ocular hypertension, bilateral: Secondary | ICD-10-CM | POA: Diagnosis not present

## 2023-07-26 DIAGNOSIS — H524 Presbyopia: Secondary | ICD-10-CM | POA: Diagnosis not present

## 2023-07-26 LAB — HM DIABETES EYE EXAM

## 2023-08-01 ENCOUNTER — Ambulatory Visit (INDEPENDENT_AMBULATORY_CARE_PROVIDER_SITE_OTHER): Payer: PPO | Admitting: Internal Medicine

## 2023-08-01 ENCOUNTER — Encounter: Payer: Self-pay | Admitting: Internal Medicine

## 2023-08-01 VITALS — BP 136/72 | Ht 67.0 in

## 2023-08-01 DIAGNOSIS — E1122 Type 2 diabetes mellitus with diabetic chronic kidney disease: Secondary | ICD-10-CM | POA: Diagnosis not present

## 2023-08-01 DIAGNOSIS — N1832 Chronic kidney disease, stage 3b: Secondary | ICD-10-CM

## 2023-08-01 DIAGNOSIS — Z23 Encounter for immunization: Secondary | ICD-10-CM

## 2023-08-01 DIAGNOSIS — N183 Chronic kidney disease, stage 3 unspecified: Secondary | ICD-10-CM | POA: Diagnosis not present

## 2023-08-01 DIAGNOSIS — S51812A Laceration without foreign body of left forearm, initial encounter: Secondary | ICD-10-CM

## 2023-08-01 DIAGNOSIS — I10 Essential (primary) hypertension: Secondary | ICD-10-CM

## 2023-08-01 NOTE — Assessment & Plan Note (Addendum)
 HbA1c: 6.3  Diet controlled Advised to follow diabetic diet On statin F/u CMP and lipid panel Diabetic eye exam: Advised to follow up with Ophthalmology for diabetic eye exam

## 2023-08-01 NOTE — Patient Instructions (Addendum)
 Please start taking Vitamin D 2000 IU once daily.  Please continue to take medications as prescribed.  Please continue to follow low carb diet and perform moderate exercise/walking as tolerated.

## 2023-08-01 NOTE — Progress Notes (Signed)
 Established Patient Office Visit  Subjective:  Patient ID: Hunter Muns., male    DOB: [birth year]  Age: 73 y.o. MRN: [id number]  CC:  Chief Complaint  Patient presents with   Medical Management of Chronic Issues    6 month f/u    HPI NICOLI NARDOZZI Sr. is a 73 y.o. male with past medical history of HTN, type II DM, CKD 3 and HLD who presents for annual physical.  HTN: BP is elevated today, but is well controlled at home around 130s/70s. Takes amlodipine 10 mg QD and Coreg 12.5 mg BID regularly. Patient denies headache, dizziness, chest pain, dyspnea or palpitations.  Type II DM: Diet controlled.  His HbA1c was 6.3 today. He denies any polyuria or polydipsia currently.  He strictly follows low-carb diet.  CKD stage III: He follows up with Dr. Lydia Sams in [place].  He denies any dysuria or hematuria currently.  He reports history of undescended testis and having renal anomaly due to it.  His TSH was borderline elevated in 07/24. Denies any change in appetite or weight, chronic fatigue, or tremors.  He had a rusted wood piece piercing his left forearm on 07/31/23. He cleaned the area and has applied tape over it. Denies any active bleeding or pus discharge.  Past Medical History:  Diagnosis Date   Diabetes mellitus without complication (HCC)    Gall bladder disease 09/29/2014   Gout    Heart murmur    Hypercholesteremia    Hypertension    Impaired glucose metabolism    PUD (peptic ulcer disease)    Reflux    Renal disorder    1 kidney that is working at 45 %.    Past Surgical History:  Procedure Laterality Date   ABDOMINAL SURGERY     CHOLECYSTECTOMY N/A 09/30/2014   Procedure: LAPAROSCOPIC CHOLECYSTECTOMY WITH INTRAOPERATIVE CHOLANGIOGRAM;  Surgeon: Juanita Norlander, MD;  Location: [facility];  Service: General;  Laterality: N/A;   COLONOSCOPY N/A 03/03/2015   Procedure: COLONOSCOPY;  Surgeon: Ruby Corporal, MD;  Location: [facility];  Service: Endoscopy;  Laterality: N/A;   730   COLONOSCOPY WITH PROPOFOL N/A 03/07/2022   Procedure: COLONOSCOPY WITH PROPOFOL;  Surgeon: Urban Garden, MD;  Location: [facility];  Service: Gastroenterology;  Laterality: N/A;  12:45PM, ASA 1-2   POLYPECTOMY  03/07/2022   Procedure: POLYPECTOMY INTESTINAL;  Surgeon: Urban Garden, MD;  Location: [facility];  Service: Gastroenterology;;    Family History  Problem Relation Age of Onset   Diabetes Mother    Heart attack Mother    Hypertension Sister     Social History   Socioeconomic History   Marital status: Married    Spouse name: Not on file   Number of children: Not on file   Years of education: Not on file   Highest education level: 9th grade  Occupational History   Not on file  Tobacco Use   Smoking status: Former    Current packs/day: 0.00    Types: Cigarettes    Quit date: 08/01/1996    Years since quitting: 27.0   Smokeless tobacco: Never  Vaping Use   Vaping status: Never Used  Substance and Sexual Activity   Alcohol use: No   Drug use: No   Sexual activity: Yes  Other Topics Concern   Not on file  Social History Narrative   Not on file   Social Drivers of Health   Financial Resource Strain: Low Risk  (  07/31/2023)   Overall Financial Resource Strain (CARDIA)    Difficulty of Paying Living Expenses: Not hard at all  Food Insecurity: No Food Insecurity (07/31/2023)   Hunger Vital Sign    Worried About Running Out of Food in the Last Year: Never true    Ran Out of Food in the Last Year: Never true  Transportation Needs: No Transportation Needs (07/31/2023)   PRAPARE - Administrator, Civil Service (Medical): No    Lack of Transportation (Non-Medical): No  Physical Activity: Sufficiently Active (07/08/2023)   Exercise Vital Sign    Days of Exercise per Week: 7 days    Minutes of Exercise per Session: 30 min  Stress: No Stress Concern Present (07/31/2023)   [employer] of Occupational Health - Occupational  Stress Questionnaire    Feeling of Stress : Not at all  Social Connections: Socially Integrated (07/31/2023)   Social Connection and Isolation Panel [NHANES]    Frequency of Communication with Friends and Family: More than three times a week    Frequency of Social Gatherings with Friends and Family: More than three times a week    Attends Religious Services: More than 4 times per year    Active Member of [employer] or Organizations: Yes    Attends Engineer, structural: More than 4 times per year    Marital Status: Married  Catering manager Violence: Not At Risk (07/08/2023)   Humiliation, Afraid, Rape, and Kick questionnaire    Fear of Current or Ex-Partner: No    Emotionally Abused: No    Physically Abused: No    Sexually Abused: No    Outpatient Medications Prior to Visit  Medication Sig Dispense Refill   amLODipine (NORVASC) 10 MG tablet Take 1 tablet by mouth once daily 90 tablet 0   aspirin 81 MG tablet Take 81 mg by mouth daily.     atorvastatin (LIPITOR) 40 MG tablet Take 1 tablet by mouth once daily 90 tablet 0   carvedilol (COREG) 12.5 MG tablet TAKE 1 TABLET BY MOUTH TWICE DAILY WITH A MEAL 180 tablet 0   diphenhydrAMINE (BENADRYL) 25 MG tablet Take 25 mg by mouth at bedtime.     fenofibrate 160 MG tablet TAKE 1 TABLET BY MOUTH AT BEDTIME 90 tablet 0   omeprazole (PRILOSEC) 20 MG capsule Take 20 mg by mouth daily as needed (for acid reflux).     Polyvinyl Alcohol-Povidone (REFRESH OP) Place 1 drop into both eyes daily as needed (dry eyes).     No facility-administered medications prior to visit.    No Known Allergies  ROS Review of Systems  Constitutional:  Negative for chills and fever.  HENT:  Negative for congestion and sore throat.   Eyes:  Negative for pain and discharge.  Respiratory:  Negative for cough and shortness of breath.   Cardiovascular:  Negative for chest pain and palpitations.  Gastrointestinal:  Negative for constipation, diarrhea, nausea and  vomiting.  Endocrine: Negative for polydipsia and polyuria.  Genitourinary:  Negative for dysuria and hematuria.  Musculoskeletal:  Negative for neck pain and neck stiffness.  Skin:  Positive for wound. Negative for rash.  Neurological:  Negative for dizziness, weakness, numbness and headaches.  Psychiatric/Behavioral:  Negative for agitation and behavioral problems.       Objective:    Physical Exam Vitals reviewed.  Constitutional:      General: He is not in acute distress.    Appearance: He is not diaphoretic.  HENT:  Head: Normocephalic and atraumatic.     Nose: Nose normal.     Mouth/Throat:     Mouth: Mucous membranes are moist.  Eyes:     General: No scleral icterus.    Extraocular Movements: Extraocular movements intact.  Cardiovascular:     Rate and Rhythm: Normal rate and regular rhythm.     Pulses: Normal pulses.     Heart sounds: Murmur (Systolic, over sternal border) heard.  Pulmonary:     Breath sounds: Normal breath sounds. No wheezing or rales.  Abdominal:     Palpations: Abdomen is soft.     Tenderness: There is no abdominal tenderness.  Musculoskeletal:     Cervical back: Neck supple. No tenderness.     Right lower leg: No edema.     Left lower leg: No edema.  Skin:    General: Skin is warm.     Findings: No rash.     Comments: Laceration injury over left forearm dorsal surface - about 1 cm in length, C/D/I  Neurological:     General: No focal deficit present.     Mental Status: He is alert and oriented to person, place, and time.     Sensory: No sensory deficit.     Motor: No weakness.  Psychiatric:        Mood and Affect: Mood normal.        Behavior: Behavior normal.     BP 136/72 (BP Location: Right Arm)   Ht 5\' 7"  (1.702 m)   BMI 25.84 kg/m  Wt Readings from Last 3 Encounters:  07/08/23 165 lb (74.8 kg)  01/31/23 171 lb 3.2 oz (77.7 kg)  08/27/22 169 lb 12.8 oz (77 kg)    Lab Results  Component Value Date   TSH 5.470 (H)  11/14/2022   Lab Results  Component Value Date   WBC 5.7 07/20/2020   HGB 13.9 07/20/2020   HCT 42.6 07/20/2020   MCV 90 07/20/2020   PLT 172 07/20/2020   Lab Results  Component Value Date   NA 140 05/29/2022   K 4.3 05/29/2022   CO2 18 (L) 05/29/2022   GLUCOSE 96 05/29/2022   BUN 30 (H) 05/29/2022   CREATININE 2.01 (H) 05/29/2022   BILITOT 0.6 11/04/2020   ALKPHOS 50 11/04/2020   AST 32 11/04/2020   ALT 23 11/04/2020   PROT 6.9 11/04/2020   ALBUMIN 4.5 11/04/2020   CALCIUM 9.4 05/29/2022   ANIONGAP 11 10/02/2014   EGFR 35 (L) 05/29/2022   Lab Results  Component Value Date   CHOL 122 11/14/2022   Lab Results  Component Value Date   HDL 34 (L) 11/14/2022   Lab Results  Component Value Date   LDLCALC 72 11/14/2022   Lab Results  Component Value Date   TRIG 78 11/14/2022   Lab Results  Component Value Date   CHOLHDL 3.6 11/14/2022   Lab Results  Component Value Date   HGBA1C 6.0 (H) 11/14/2022      Assessment & Plan:   Problem List Items Addressed This Visit       Cardiovascular and Mediastinum   HTN (hypertension)   BP Readings from Last 1 Encounters:  08/01/23 136/72   Well-controlled with amlodipine and Coreg at home Since his BP is consistently well-controlled at home, would continue same regimen Counseled for compliance with the medications Advised DASH diet and moderate exercise/walking as tolerated        Endocrine   Controlled type 2 diabetes mellitus with  chronic kidney disease (HCC) - Primary   HbA1c: 6.3  Diet controlled Advised to follow diabetic diet On statin F/u CMP and lipid panel Diabetic eye exam: Advised to follow up with Ophthalmology for diabetic eye exam      Relevant Orders   Bayer DCA Hb A1c Waived   Urine Microalbumin w/creat. ratio     Genitourinary   Chronic renal failure, stage 3 (moderate) (HCC)   Followed by Dr. Venita Seng-nephrology Last BMP showed GFR - 42, overall stable Needs to maintain adequate  hydration Avoid nephrotoxic agents including NSAIDs        Other   Laceration of left forearm   Has a rusted wood piercing injury of left forearm - about 1 cm in length Tdap vaccine today Keep area clean and dry      Relevant Orders   Tdap vaccine greater than or equal to 7yo IM (Completed)     No orders of the defined types were placed in this encounter.   Follow-up: Return in about 6 months (around 01/31/2024) for Annual physical (after 01/31/24).    Meldon Sport, MD

## 2023-08-01 NOTE — Assessment & Plan Note (Signed)
 Followed by Dr. Loneta Tamplin-nephrology Last BMP showed GFR - 42, overall stable Needs to maintain adequate hydration Avoid nephrotoxic agents including NSAIDs

## 2023-08-01 NOTE — Assessment & Plan Note (Addendum)
 Has a rusted wood piercing injury of left forearm - about 1 cm in length Tdap vaccine today Keep area clean and dry

## 2023-08-01 NOTE — Assessment & Plan Note (Signed)
 BP Readings from Last 1 Encounters:  08/01/23 136/72   Well-controlled with amlodipine and Coreg at home Since his BP is consistently well-controlled at home, would continue same regimen Counseled for compliance with the medications Advised DASH diet and moderate exercise/walking as tolerated

## 2023-08-02 LAB — BAYER DCA HB A1C WAIVED: HB A1C (BAYER DCA - WAIVED): 6.3 % — ABNORMAL HIGH (ref 4.8–5.6)

## 2023-08-04 LAB — MICROALBUMIN / CREATININE URINE RATIO
Creatinine, Urine: 17.4 mg/dL
Microalb/Creat Ratio: 41 mg/g{creat} — ABNORMAL HIGH (ref 0–29)
Microalbumin, Urine: 7.2 ug/mL

## 2023-08-06 ENCOUNTER — Other Ambulatory Visit: Payer: Self-pay | Admitting: Internal Medicine

## 2023-08-06 DIAGNOSIS — I10 Essential (primary) hypertension: Secondary | ICD-10-CM

## 2023-08-16 ENCOUNTER — Encounter: Payer: Self-pay | Admitting: Internal Medicine

## 2023-08-20 ENCOUNTER — Other Ambulatory Visit: Payer: Self-pay | Admitting: Internal Medicine

## 2023-08-20 DIAGNOSIS — I10 Essential (primary) hypertension: Secondary | ICD-10-CM

## 2023-09-13 ENCOUNTER — Other Ambulatory Visit: Payer: Self-pay | Admitting: Internal Medicine

## 2023-09-13 DIAGNOSIS — E785 Hyperlipidemia, unspecified: Secondary | ICD-10-CM

## 2023-11-15 ENCOUNTER — Other Ambulatory Visit: Payer: Self-pay | Admitting: Internal Medicine

## 2023-11-15 DIAGNOSIS — I10 Essential (primary) hypertension: Secondary | ICD-10-CM

## 2023-11-18 DIAGNOSIS — N1832 Chronic kidney disease, stage 3b: Secondary | ICD-10-CM | POA: Diagnosis not present

## 2023-11-26 DIAGNOSIS — D631 Anemia in chronic kidney disease: Secondary | ICD-10-CM | POA: Diagnosis not present

## 2023-11-26 DIAGNOSIS — N2581 Secondary hyperparathyroidism of renal origin: Secondary | ICD-10-CM | POA: Diagnosis not present

## 2023-11-26 DIAGNOSIS — I129 Hypertensive chronic kidney disease with stage 1 through stage 4 chronic kidney disease, or unspecified chronic kidney disease: Secondary | ICD-10-CM | POA: Diagnosis not present

## 2023-11-26 DIAGNOSIS — N1832 Chronic kidney disease, stage 3b: Secondary | ICD-10-CM | POA: Diagnosis not present

## 2023-12-09 ENCOUNTER — Other Ambulatory Visit: Payer: Self-pay | Admitting: Internal Medicine

## 2023-12-09 DIAGNOSIS — I10 Essential (primary) hypertension: Secondary | ICD-10-CM

## 2023-12-09 DIAGNOSIS — E785 Hyperlipidemia, unspecified: Secondary | ICD-10-CM

## 2023-12-15 ENCOUNTER — Other Ambulatory Visit: Payer: Self-pay | Admitting: Internal Medicine

## 2023-12-15 DIAGNOSIS — E785 Hyperlipidemia, unspecified: Secondary | ICD-10-CM

## 2023-12-15 DIAGNOSIS — I10 Essential (primary) hypertension: Secondary | ICD-10-CM

## 2024-01-29 ENCOUNTER — Encounter (INDEPENDENT_AMBULATORY_CARE_PROVIDER_SITE_OTHER): Payer: Self-pay | Admitting: Gastroenterology

## 2024-02-03 ENCOUNTER — Encounter: Admitting: Internal Medicine

## 2024-02-16 ENCOUNTER — Other Ambulatory Visit: Payer: Self-pay | Admitting: Internal Medicine

## 2024-02-16 DIAGNOSIS — I10 Essential (primary) hypertension: Secondary | ICD-10-CM

## 2024-03-09 ENCOUNTER — Other Ambulatory Visit: Payer: Self-pay | Admitting: Internal Medicine

## 2024-03-09 DIAGNOSIS — E785 Hyperlipidemia, unspecified: Secondary | ICD-10-CM

## 2024-03-09 DIAGNOSIS — H40023 Open angle with borderline findings, high risk, bilateral: Secondary | ICD-10-CM | POA: Diagnosis not present

## 2024-03-09 DIAGNOSIS — I10 Essential (primary) hypertension: Secondary | ICD-10-CM

## 2024-03-16 ENCOUNTER — Encounter: Payer: Self-pay | Admitting: Internal Medicine

## 2024-03-16 ENCOUNTER — Ambulatory Visit: Payer: Self-pay | Admitting: Internal Medicine

## 2024-03-16 ENCOUNTER — Other Ambulatory Visit: Payer: Self-pay | Admitting: Internal Medicine

## 2024-03-16 VITALS — BP 138/74 | HR 60 | Ht 67.0 in | Wt 180.4 lb

## 2024-03-16 DIAGNOSIS — E1122 Type 2 diabetes mellitus with diabetic chronic kidney disease: Secondary | ICD-10-CM | POA: Diagnosis not present

## 2024-03-16 DIAGNOSIS — I10 Essential (primary) hypertension: Secondary | ICD-10-CM | POA: Diagnosis not present

## 2024-03-16 DIAGNOSIS — E785 Hyperlipidemia, unspecified: Secondary | ICD-10-CM

## 2024-03-16 DIAGNOSIS — N1832 Chronic kidney disease, stage 3b: Secondary | ICD-10-CM

## 2024-03-16 DIAGNOSIS — I350 Nonrheumatic aortic (valve) stenosis: Secondary | ICD-10-CM

## 2024-03-16 DIAGNOSIS — E559 Vitamin D deficiency, unspecified: Secondary | ICD-10-CM | POA: Diagnosis not present

## 2024-03-16 DIAGNOSIS — Z125 Encounter for screening for malignant neoplasm of prostate: Secondary | ICD-10-CM

## 2024-03-16 DIAGNOSIS — Z0001 Encounter for general adult medical examination with abnormal findings: Secondary | ICD-10-CM

## 2024-03-16 MED ORDER — AMLODIPINE BESYLATE 10 MG PO TABS: 10.0000 mg | ORAL_TABLET | Freq: Every day | ORAL | 3 refills | Status: AC

## 2024-03-16 MED ORDER — CARVEDILOL 12.5 MG PO TABS: 12.5000 mg | ORAL_TABLET | Freq: Two times a day (BID) | ORAL | 3 refills | Status: AC

## 2024-03-16 MED ORDER — ATORVASTATIN CALCIUM 40 MG PO TABS: 40.0000 mg | ORAL_TABLET | Freq: Every day | ORAL | 3 refills | Status: AC

## 2024-03-16 MED ORDER — FENOFIBRATE 160 MG PO TABS: 160.0000 mg | ORAL_TABLET | Freq: Every day | ORAL | 3 refills | Status: AC

## 2024-03-16 NOTE — Assessment & Plan Note (Signed)
Physical exam as documented. Fasting blood tests ordered today. Advised to get Tdap vaccines at local pharmacy.

## 2024-03-16 NOTE — Assessment & Plan Note (Signed)
 Lab Results  Component Value Date   HGBA1C 6.3 (H) 08/01/2023   Diet controlled Advised to follow diabetic diet On statin F/u CMP and lipid panel Diabetic eye exam: Advised to follow up with Ophthalmology for diabetic eye exam

## 2024-03-16 NOTE — Assessment & Plan Note (Addendum)
 On statin and fenofibrate  currently Check lipid profile

## 2024-03-16 NOTE — Assessment & Plan Note (Signed)
Has systolic murmur Echo showed mild AS Asymptomatic currently 

## 2024-03-16 NOTE — Patient Instructions (Signed)
Please continue to take medications as prescribed.  Please continue to follow low salt diet and perform moderate exercise/walking at least 150 mins/week. 

## 2024-03-16 NOTE — Assessment & Plan Note (Addendum)
 BP Readings from Last 1 Encounters:  03/16/24 138/74   Usually well-controlled with amlodipine  and Coreg  at home, refilled Since his BP is consistently well-controlled at home, would continue same regimen Counseled for compliance with the medications Advised DASH diet and moderate exercise/walking as tolerated

## 2024-03-16 NOTE — Assessment & Plan Note (Signed)
 Ordered PSA after discussing its limitations for prostate cancer screening, including false positive results leading to additional investigations.

## 2024-03-16 NOTE — Progress Notes (Signed)
 Established Patient Office Visit  Subjective:  Patient ID: Hunter Meneely., male    DOB: [birth year]  Age: 73 y.o. MRN: [id number]  CC:  Chief Complaint  Patient presents with   Annual Exam    Cpe, states he ran out of bp medication.    HPI Hunter CLINE Sr. is a 73 y.o. male with past medical history of HTN, type II DM, CKD 3 and HLD who presents for annual physical.  HTN: BP is elevated today, but is well controlled at home around 130s/70s. Takes amlodipine  10 mg QD and Coreg  12.5 mg BID regularly, but has run out of carvedilol  for the last 1 week. Patient denies headache, dizziness, chest pain, dyspnea or palpitations.  Type II DM: Diet controlled.  His HbA1c was 6.3 in 04/25. He denies any polyuria or polydipsia currently.  He strictly follows low-carb diet.  CKD stage III: He follows up with Dr. Tobie in [place].  He denies any dysuria or hematuria currently.  He has history of undescended testis and having renal anomaly due to it.  Past Medical History:  Diagnosis Date   Diabetes mellitus without complication (HCC)    Gall bladder disease 09/29/2014   Gout    Heart murmur    Hypercholesteremia    Hypertension    Impaired glucose metabolism    PUD (peptic ulcer disease)    Reflux    Renal disorder    1 kidney that is working at 45 %.    Past Surgical History:  Procedure Laterality Date   ABDOMINAL SURGERY     CHOLECYSTECTOMY N/A 09/30/2014   Procedure: LAPAROSCOPIC CHOLECYSTECTOMY WITH INTRAOPERATIVE CHOLANGIOGRAM;  Surgeon: Alm Angle, MD;  Location: [facility];  Service: General;  Laterality: N/A;   COLONOSCOPY N/A 03/03/2015   Procedure: COLONOSCOPY;  Surgeon: Claudis RAYMOND Rivet, MD;  Location: [facility];  Service: Endoscopy;  Laterality: N/A;  730   COLONOSCOPY WITH PROPOFOL  N/A 03/07/2022   Procedure: COLONOSCOPY WITH PROPOFOL ;  Surgeon: Eartha Angelia Sieving, MD;  Location: [facility];  Service: Gastroenterology;  Laterality: N/A;  12:45PM, ASA 1-2    POLYPECTOMY  03/07/2022   Procedure: POLYPECTOMY INTESTINAL;  Surgeon: Eartha Angelia, Sieving, MD;  Location: [facility];  Service: Gastroenterology;;    Family History  Problem Relation Age of Onset   Diabetes Mother    Heart attack Mother    Hypertension Sister     Social History   Socioeconomic History   Marital status: Married    Spouse name: Not on file   Number of children: Not on file   Years of education: Not on file   Highest education level: 9th grade  Occupational History   Not on file  Tobacco Use   Smoking status: Former    Current packs/day: 0.00    Types: Cigarettes    Quit date: 08/01/1996    Years since quitting: 27.6   Smokeless tobacco: Never  Vaping Use   Vaping status: Never Used  Substance and Sexual Activity   Alcohol use: No   Drug use: No   Sexual activity: Yes  Other Topics Concern   Not on file  Social History Narrative   Not on file   Social Drivers of Health   Financial Resource Strain: Low Risk  (07/31/2023)   Overall Financial Resource Strain (CARDIA)    Difficulty of Paying Living Expenses: Not hard at all  Food Insecurity: No Food Insecurity (07/31/2023)   Hunger Vital Sign  Worried About Programme Researcher, Broadcasting/film/video in the Last Year: Never true    Ran Out of Food in the Last Year: Never true  Transportation Needs: No Transportation Needs (07/31/2023)   PRAPARE - Administrator, Civil Service (Medical): No    Lack of Transportation (Non-Medical): No  Physical Activity: Sufficiently Active (07/08/2023)   Exercise Vital Sign    Days of Exercise per Week: 7 days    Minutes of Exercise per Session: 30 min  Stress: No Stress Concern Present (07/31/2023)   [employer] of Occupational Health - Occupational Stress Questionnaire    Feeling of Stress : Not at all  Social Connections: Socially Integrated (07/31/2023)   Social Connection and Isolation Panel    Frequency of Communication with Friends and Family: More than three  times a week    Frequency of Social Gatherings with Friends and Family: More than three times a week    Attends Religious Services: More than 4 times per year    Active Member of [employer] or Organizations: Yes    Attends Engineer, Structural: More than 4 times per year    Marital Status: Married  Catering Manager Violence: Not At Risk (07/08/2023)   Humiliation, Afraid, Rape, and Kick questionnaire    Fear of Current or Ex-Partner: No    Emotionally Abused: No    Physically Abused: No    Sexually Abused: No    Outpatient Medications Prior to Visit  Medication Sig Dispense Refill   aspirin  81 MG tablet Take 81 mg by mouth daily.     diphenhydrAMINE  (BENADRYL ) 25 MG tablet Take 25 mg by mouth at bedtime.     omeprazole (PRILOSEC) 20 MG capsule Take 20 mg by mouth daily as needed (for acid reflux).     Polyvinyl Alcohol-Povidone (REFRESH OP) Place 1 drop into both eyes daily as needed (dry eyes).     amLODipine  (NORVASC ) 10 MG tablet Take 1 tablet by mouth once daily 90 tablet 0   atorvastatin  (LIPITOR) 40 MG tablet Take 1 tablet by mouth once daily 90 tablet 0   carvedilol  (COREG ) 12.5 MG tablet TAKE 1 TABLET BY MOUTH TWICE DAILY WITH A MEAL 180 tablet 0   fenofibrate  160 MG tablet TAKE 1 TABLET BY MOUTH AT BEDTIME 90 tablet 0   No facility-administered medications prior to visit.    No Known Allergies  ROS Review of Systems  Constitutional:  Negative for chills and fever.  HENT:  Negative for congestion and sore throat.   Eyes:  Negative for pain and discharge.  Respiratory:  Negative for cough and shortness of breath.   Cardiovascular:  Negative for chest pain and palpitations.  Gastrointestinal:  Negative for constipation, diarrhea, nausea and vomiting.  Endocrine: Negative for polydipsia and polyuria.  Genitourinary:  Negative for dysuria and hematuria.  Musculoskeletal:  Negative for neck pain and neck stiffness.  Skin:  Negative for rash.  Neurological:  Negative  for dizziness, weakness, numbness and headaches.  Psychiatric/Behavioral:  Negative for agitation and behavioral problems.       Objective:    Physical Exam Vitals reviewed.  Constitutional:      General: He is not in acute distress.    Appearance: He is not diaphoretic.  HENT:     Head: Normocephalic and atraumatic.     Nose: Nose normal.     Mouth/Throat:     Mouth: Mucous membranes are moist.  Eyes:     General: No scleral icterus.  Extraocular Movements: Extraocular movements intact.  Cardiovascular:     Rate and Rhythm: Normal rate and regular rhythm.     Pulses: Normal pulses.     Heart sounds: Murmur (Systolic, over sternal border) heard.  Pulmonary:     Breath sounds: Normal breath sounds. No wheezing or rales.  Abdominal:     Palpations: Abdomen is soft.     Tenderness: There is no abdominal tenderness.  Musculoskeletal:     Cervical back: Neck supple. No tenderness.     Right lower leg: No edema.     Left lower leg: No edema.  Skin:    General: Skin is warm.     Findings: No rash.  Neurological:     General: No focal deficit present.     Mental Status: He is alert and oriented to person, place, and time.     Cranial Nerves: No cranial nerve deficit.     Sensory: No sensory deficit.     Motor: No weakness.  Psychiatric:        Mood and Affect: Mood normal.        Behavior: Behavior normal.     BP 138/74 (BP Location: Left Arm) Comment: At home  Pulse 60   Ht 5' 7 (1.702 m)   Wt 180 lb 6.4 oz (81.8 kg)   SpO2 99%   BMI 28.25 kg/m  Wt Readings from Last 3 Encounters:  03/16/24 180 lb 6.4 oz (81.8 kg)  07/08/23 165 lb (74.8 kg)  01/31/23 171 lb 3.2 oz (77.7 kg)    Lab Results  Component Value Date   TSH 5.470 (H) 11/14/2022   Lab Results  Component Value Date   WBC 5.7 07/20/2020   HGB 13.9 07/20/2020   HCT 42.6 07/20/2020   MCV 90 07/20/2020   PLT 172 07/20/2020   Lab Results  Component Value Date   NA 140 05/29/2022   K 4.3  05/29/2022   CO2 18 (L) 05/29/2022   GLUCOSE 96 05/29/2022   BUN 30 (H) 05/29/2022   CREATININE 2.01 (H) 05/29/2022   BILITOT 0.6 11/04/2020   ALKPHOS 50 11/04/2020   AST 32 11/04/2020   ALT 23 11/04/2020   PROT 6.9 11/04/2020   ALBUMIN 4.5 11/04/2020   CALCIUM  9.4 05/29/2022   ANIONGAP 11 10/02/2014   EGFR 35 (L) 05/29/2022   Lab Results  Component Value Date   CHOL 122 11/14/2022   Lab Results  Component Value Date   HDL 34 (L) 11/14/2022   Lab Results  Component Value Date   LDLCALC 72 11/14/2022   Lab Results  Component Value Date   TRIG 78 11/14/2022   Lab Results  Component Value Date   CHOLHDL 3.6 11/14/2022   Lab Results  Component Value Date   HGBA1C 6.3 (H) 08/01/2023      Assessment & Plan:   Problem List Items Addressed This Visit       Cardiovascular and Mediastinum   HTN (hypertension)   BP Readings from Last 1 Encounters:  03/16/24 138/74   Usually well-controlled with amlodipine  and Coreg  at home, refilled Since his BP is consistently well-controlled at home, would continue same regimen Counseled for compliance with the medications Advised DASH diet and moderate exercise/walking as tolerated      Relevant Medications   amLODipine  (NORVASC ) 10 MG tablet   atorvastatin  (LIPITOR) 40 MG tablet   carvedilol  (COREG ) 12.5 MG tablet   fenofibrate  160 MG tablet   Other Relevant Orders   CMP14+EGFR  TSH + free T4   Nonrheumatic aortic valve stenosis   Has systolic murmur Echo showed mild AS Asymptomatic currently      Relevant Medications   amLODipine  (NORVASC ) 10 MG tablet   atorvastatin  (LIPITOR) 40 MG tablet   carvedilol  (COREG ) 12.5 MG tablet   fenofibrate  160 MG tablet     Endocrine   Controlled type 2 diabetes mellitus with chronic kidney disease (HCC)   Lab Results  Component Value Date   HGBA1C 6.3 (H) 08/01/2023   Diet controlled Advised to follow diabetic diet On statin F/u CMP and lipid panel Diabetic eye exam:  Advised to follow up with Ophthalmology for diabetic eye exam      Relevant Medications   atorvastatin  (LIPITOR) 40 MG tablet   Other Relevant Orders   CMP14+EGFR   Hemoglobin A1c     Genitourinary   Chronic renal failure, stage 3 (moderate) (HCC)   Followed by Dr. Anelisse Jacobson-nephrology Last BMP showed GFR - 45, overall stable Needs to maintain adequate hydration Avoid nephrotoxic agents including NSAIDs        Other   Hyperlipidemia LDL goal <100   On statin and fenofibrate  currently Check lipid profile      Relevant Medications   amLODipine  (NORVASC ) 10 MG tablet   atorvastatin  (LIPITOR) 40 MG tablet   carvedilol  (COREG ) 12.5 MG tablet   fenofibrate  160 MG tablet   Other Relevant Orders   Lipid Profile   Encounter for general adult medical examination with abnormal findings - Primary   Physical exam as documented. Fasting blood tests ordered today. Advised to get Tdap vaccines at local pharmacy.      Prostate cancer screening   Ordered PSA after discussing its limitations for prostate cancer screening, including false positive results leading to additional investigations.      Relevant Orders   PSA   Other Visit Diagnoses       Vitamin D deficiency       Relevant Orders   Vitamin D (25 hydroxy)        Meds ordered this encounter  Medications   amLODipine  (NORVASC ) 10 MG tablet    Sig: Take 1 tablet (10 mg total) by mouth daily.    Dispense:  90 tablet    Refill:  3   atorvastatin  (LIPITOR) 40 MG tablet    Sig: Take 1 tablet (40 mg total) by mouth daily.    Dispense:  90 tablet    Refill:  3   carvedilol  (COREG ) 12.5 MG tablet    Sig: Take 1 tablet (12.5 mg total) by mouth 2 (two) times daily with a meal.    Dispense:  180 tablet    Refill:  3   fenofibrate  160 MG tablet    Sig: Take 1 tablet (160 mg total) by mouth at bedtime.    Dispense:  90 tablet    Refill:  3    Follow-up: Return in about 6 months (around 09/14/2024) for HTN and DM.     Suzzane MARLA Blanch, MD

## 2024-03-16 NOTE — Assessment & Plan Note (Signed)
 Followed by Dr. Rocio Roam-nephrology Last BMP showed GFR - 45, overall stable Needs to maintain adequate hydration Avoid nephrotoxic agents including NSAIDs

## 2024-03-17 ENCOUNTER — Ambulatory Visit: Payer: Self-pay | Admitting: Internal Medicine

## 2024-03-17 LAB — CMP14+EGFR
ALT: 23 IU/L (ref 0–44)
AST: 31 IU/L (ref 0–40)
Albumin: 4.4 g/dL (ref 3.8–4.8)
Alkaline Phosphatase: 66 IU/L (ref 47–123)
BUN/Creatinine Ratio: 18 (ref 10–24)
BUN: 30 mg/dL — ABNORMAL HIGH (ref 8–27)
Bilirubin Total: 0.5 mg/dL (ref 0.0–1.2)
CO2: 20 mmol/L (ref 20–29)
Calcium: 9.2 mg/dL (ref 8.6–10.2)
Chloride: 103 mmol/L (ref 96–106)
Creatinine, Ser: 1.71 mg/dL — ABNORMAL HIGH (ref 0.76–1.27)
Globulin, Total: 2.3 g/dL (ref 1.5–4.5)
Glucose: 98 mg/dL (ref 70–99)
Potassium: 4.4 mmol/L (ref 3.5–5.2)
Sodium: 138 mmol/L (ref 134–144)
Total Protein: 6.7 g/dL (ref 6.0–8.5)
eGFR: 42 mL/min/1.73 — ABNORMAL LOW (ref >59)

## 2024-03-17 LAB — LIPID PANEL
Chol/HDL Ratio: 4.6 ratio (ref 0.0–5.0)
Cholesterol, Total: 156 mg/dL (ref 100–199)
HDL: 34 mg/dL — ABNORMAL LOW (ref >39)
LDL Chol Calc (NIH): 82 mg/dL (ref 0–99)
Triglycerides: 243 mg/dL — ABNORMAL HIGH (ref 0–149)
VLDL Cholesterol Cal: 40 mg/dL (ref 5–40)

## 2024-03-17 LAB — HEMOGLOBIN A1C
Est. average glucose Bld gHb Est-mCnc: 134 mg/dL
Hgb A1c MFr Bld: 6.3 % — ABNORMAL HIGH (ref 4.8–5.6)

## 2024-03-17 LAB — VITAMIN D 25 HYDROXY (VIT D DEFICIENCY, FRACTURES): Vit D, 25-Hydroxy: 26.5 ng/mL — ABNORMAL LOW (ref 30.0–100.0)

## 2024-03-17 LAB — TSH+FREE T4
Free T4: 1.03 ng/dL (ref 0.82–1.77)
TSH: 5.51 u[IU]/mL — ABNORMAL HIGH (ref 0.450–4.500)

## 2024-03-19 LAB — SPECIMEN STATUS REPORT

## 2024-03-19 LAB — PSA: Prostate Specific Ag, Serum: 1.5 ng/mL (ref 0.0–4.0)

## 2024-07-08 ENCOUNTER — Ambulatory Visit

## 2024-09-14 ENCOUNTER — Ambulatory Visit: Admitting: Internal Medicine
